# Patient Record
Sex: Male | Born: 1985 | Race: White | Hispanic: No | Marital: Married | Smoking: Never smoker
Health system: Southern US, Community
[De-identification: ages and names within clinical notes are randomized; demographics above are authoritative.]

## PROBLEM LIST (undated history)

## (undated) DIAGNOSIS — G4730 Sleep apnea, unspecified: Secondary | ICD-10-CM

## (undated) DIAGNOSIS — G5603 Carpal tunnel syndrome, bilateral upper limbs: Secondary | ICD-10-CM

## (undated) DIAGNOSIS — E669 Obesity, unspecified: Secondary | ICD-10-CM

## (undated) HISTORY — DX: Sleep apnea, unspecified: G47.30

## (undated) HISTORY — PX: CARPAL TUNNEL RELEASE: SHX101

## (undated) HISTORY — DX: Carpal tunnel syndrome, bilateral upper limbs: G56.03

## (undated) HISTORY — PX: KNEE SURGERY: SHX244

---

## 2012-02-07 ENCOUNTER — Emergency Department: Payer: Self-pay | Admitting: Emergency Medicine

## 2016-07-16 DIAGNOSIS — M25562 Pain in left knee: Secondary | ICD-10-CM | POA: Diagnosis not present

## 2016-07-16 DIAGNOSIS — Z131 Encounter for screening for diabetes mellitus: Secondary | ICD-10-CM | POA: Diagnosis not present

## 2016-07-16 DIAGNOSIS — Z0389 Encounter for observation for other suspected diseases and conditions ruled out: Secondary | ICD-10-CM | POA: Diagnosis not present

## 2016-07-16 DIAGNOSIS — R079 Chest pain, unspecified: Secondary | ICD-10-CM | POA: Diagnosis not present

## 2016-07-17 ENCOUNTER — Telehealth: Payer: Self-pay

## 2016-07-17 ENCOUNTER — Encounter: Payer: Self-pay | Admitting: Emergency Medicine

## 2016-07-17 ENCOUNTER — Emergency Department: Payer: BLUE CROSS/BLUE SHIELD

## 2016-07-17 ENCOUNTER — Emergency Department
Admission: EM | Admit: 2016-07-17 | Discharge: 2016-07-17 | Disposition: A | Discharge status: Home / Self Care | Payer: BLUE CROSS/BLUE SHIELD | Attending: Emergency Medicine | Admitting: Emergency Medicine

## 2016-07-17 DIAGNOSIS — R0602 Shortness of breath: Secondary | ICD-10-CM | POA: Insufficient documentation

## 2016-07-17 DIAGNOSIS — R0789 Other chest pain: Secondary | ICD-10-CM | POA: Diagnosis not present

## 2016-07-17 DIAGNOSIS — R079 Chest pain, unspecified: Secondary | ICD-10-CM | POA: Insufficient documentation

## 2016-07-17 LAB — TROPONIN I: Troponin I: <0.03 ng/mL (ref <0.03)

## 2016-07-17 LAB — CBC
HCT: 44.2 % (ref 40.0–52.0)
Hemoglobin: 15.6 g/dL (ref 13.0–18.0)
MCH: 29.2 pg (ref 26.0–34.0)
MCHC: 35.3 g/dL (ref 32.0–36.0)
MCV: 82.8 fL (ref 80.0–100.0)
PLATELETS: 358 10*3/uL (ref 150–440)
RBC: 5.35 MIL/uL (ref 4.40–5.90)
RDW: 13 % (ref 11.5–14.5)
WBC: 9.6 10*3/uL (ref 3.8–10.6)

## 2016-07-17 LAB — BASIC METABOLIC PANEL
Anion gap: 8 (ref 5–15)
BUN: 14 mg/dL (ref 6–20)
CO2: 29 mmol/L (ref 22–32)
CREATININE: 1.15 mg/dL (ref 0.61–1.24)
Calcium: 9.4 mg/dL (ref 8.9–10.3)
Chloride: 101 mmol/L (ref 101–111)
GFR calc Af Amer: >60 mL/min (ref >60)
GFR calc non Af Amer: >60 mL/min (ref >60)
Glucose, Bld: 132 mg/dL — ABNORMAL HIGH (ref 65–99)
Potassium: 4 mmol/L (ref 3.5–5.1)
SODIUM: 138 mmol/L (ref 135–145)

## 2016-07-17 LAB — FIBRIN DERIVATIVES D-DIMER (ARMC ONLY): Fibrin derivatives D-dimer (ARMC): 162 (ref 0–499)

## 2016-07-17 MED ORDER — IBUPROFEN 600 MG PO TABS: 600.0000 mg | ORAL_TABLET | Freq: Three times a day (TID) | ORAL | 0 refills | Status: DC | PRN

## 2016-07-17 MED ORDER — DIAZEPAM 5 MG PO TABS: 5.0000 mg | ORAL_TABLET | Freq: Three times a day (TID) | ORAL | 0 refills | Status: DC | PRN

## 2016-07-17 NOTE — ED Provider Notes (Addendum)
[facility] Emergency Department Provider Note        Time seen: ----------------------------------------- 2:40 PM on 07/17/2016 -----------------------------------------    I have reviewed the triage vital signs and the nursing notes.   HISTORY  Chief Complaint Chest Pain    HPI Lauretta ChesterKevin C Formica is a 31 y.o. male who presents to the ER for ongoing chest pain for years but with shortness of breath recently. [facility] was told he needed to see a cardiologist. Patient states EKG they did yesterday showed some abnormalities. [facility] so he came here. His chest pain was 7 out of 10, nothing makes it better or worse. He does admit to being under stress. He denies recent illness. He has no specific risk factors that he is aware of.   History reviewed. No pertinent past medical history.  There are no active problems to display for this patient.   History reviewed. No pertinent surgical history.  Allergies Patient has no known allergies.  Social History Social History  Substance Use Topics  . Smoking status: Never Smoker  . Smokeless tobacco: Never Used  . Alcohol use No    Review of Systems Constitutional: Negative for fever. Cardiovascular:Positive for chest pain Respiratory:  Positive shortness of breath Gastrointestinal: Negative for abdominal pain, vomiting and diarrhea. Genitourinary: Negative for dysuria. Musculoskeletal: Negative for back pain. Skin: Negative for rash. Neurological: Negative for headaches, focal weakness or numbness.  10-point ROS otherwise negative.  ____________________________________________   PHYSICAL EXAM:  VITAL SIGNS: ED Triage Vitals  Enc Vitals Group     BP 07/17/16 1300 (!) 153/92     Pulse Rate 07/17/16 1300 (!) 115     Resp 07/17/16 1300 18     Temp 07/17/16 1300 98.9 F (37.2 C)     Temp Source 07/17/16 1300 Oral   SpO2 07/17/16 1300 96 %     Weight 07/17/16 1301 285 lb (129.3 kg)     Height 07/17/16 1301 5\' 9"  (1.753 m)     Head Circumference --      Peak Flow --      Pain Score 07/17/16 1301 8     Pain Loc --      Pain Edu? --      Excl. in GC? --     Constitutional: Alert and oriented. Well appearing and in no distress. Eyes: Conjunctivae are normal. PERRL. Normal extraocular movements. ENT   Head: Normocephalic and atraumatic.   Nose: No congestion/rhinnorhea.   Mouth/Throat: Mucous membranes are moist.   Neck: No stridor. Cardiovascular: Normal rate, regular rhythm. No murmurs, rubs, or gallops. Respiratory: Normal respiratory effort without tachypnea nor retractions. Breath sounds are clear and equal bilaterally. No wheezes/rales/rhonchi. Gastrointestinal: Soft and nontender. Normal bowel sounds Musculoskeletal: Nontender with normal range of motion in all extremities. No lower extremity tenderness nor edema. Neurologic:  Normal speech and language. No gross focal neurologic deficits are appreciated.  Skin:  Skin is warm, dry and intact. No rash noted. Psychiatric: Mood and affect are normal. Speech and behavior are normal.  ____________________________________________  EKG: Interpreted by me. sinus tachycardia with rate of 109 bpm, normal PR interval, normal QRS, normal QT, normal axis.  ____________________________________________  ED COURSE:  Pertinent labs & imaging results that were available during my care of the patient were reviewed by me and considered in my medical decision making (see chart for details).  Patient  presents to ER with chest pain of uncertain etiology. We will assess with labs and imaging.   Procedures ____________________________________________   LABS (pertinent positives/negatives)  Labs Reviewed  BASIC METABOLIC PANEL - Abnormal; Notable for the following:       Result Value   Glucose, Bld 132 (*)    All other components within normal  limits  CBC  TROPONIN I  FIBRIN DERIVATIVES D-DIMER ([facility] ONLY)  TROPONIN I    RADIOLOGY  Chest x-ray is normal  ____________________________________________  FINAL ASSESSMENT AND PLAN   Chest pain  Plan: Patient with labs and imaging as dictated above.  No specific etiology for his symptoms. He'll be discharged with Motrin and Valium and encouraged to have close follow-up with outpatient cardiology. Heart score is low risk for ACS.   Emily Filbert, MD   Note: This note was generated in part or whole with voice recognition software. Voice recognition is usually quite accurate but there are transcription errors that can and very often do occur. I apologize for any typographical errors that were not detected and corrected.     Emily Filbert, MD 07/17/16 1442    Emily Filbert, MD 07/17/16 [phone]

## 2016-07-17 NOTE — ED Triage Notes (Signed)
Pt with ongoing chest pain for years but with shortness of breath recently. [facility] and was told he needed to see a cardiologist. Pt states the ecg they did yesterday showed that he had some abnormalities.  [facility] so he came here.

## 2016-07-17 NOTE — Telephone Encounter (Signed)
Lmov for patient for patient was seen in ED on 07/17/16  °Seen for CP  °Will try again at a later time °

## 2016-07-20 NOTE — Telephone Encounter (Signed)
Lmov for patient for patient was seen in ED on 07/17/16  Seen for CP  Will try again at a later time

## 2016-07-21 NOTE — Telephone Encounter (Signed)
Sent letter

## 2016-07-29 ENCOUNTER — Other Ambulatory Visit: Payer: Self-pay | Admitting: Unknown Physician Specialty

## 2016-07-29 ENCOUNTER — Encounter: Payer: Self-pay | Admitting: Unknown Physician Specialty

## 2016-07-29 ENCOUNTER — Encounter: Payer: Self-pay | Admitting: Family Medicine

## 2016-07-29 ENCOUNTER — Telehealth: Payer: Self-pay | Admitting: Unknown Physician Specialty

## 2016-07-29 ENCOUNTER — Ambulatory Visit (INDEPENDENT_AMBULATORY_CARE_PROVIDER_SITE_OTHER): Payer: BLUE CROSS/BLUE SHIELD | Admitting: Unknown Physician Specialty

## 2016-07-29 ENCOUNTER — Ambulatory Visit
Admission: RE | Admit: 2016-07-29 | Discharge: 2016-07-29 | Disposition: A | Discharge status: Home / Self Care | Payer: BLUE CROSS/BLUE SHIELD | Source: Ambulatory Visit | Attending: Unknown Physician Specialty | Admitting: Unknown Physician Specialty

## 2016-07-29 VITALS — BP 131/87 | HR 88 | Temp 98.3°F | Wt 289.2 lb

## 2016-07-29 DIAGNOSIS — J029 Acute pharyngitis, unspecified: Secondary | ICD-10-CM | POA: Diagnosis not present

## 2016-07-29 DIAGNOSIS — R05 Cough: Secondary | ICD-10-CM

## 2016-07-29 DIAGNOSIS — J018 Other acute sinusitis: Secondary | ICD-10-CM | POA: Diagnosis not present

## 2016-07-29 DIAGNOSIS — R059 Cough, unspecified: Secondary | ICD-10-CM

## 2016-07-29 DIAGNOSIS — J9811 Atelectasis: Secondary | ICD-10-CM | POA: Insufficient documentation

## 2016-07-29 MED ORDER — ALBUTEROL SULFATE HFA 108 (90 BASE) MCG/ACT IN AERS: 2.0000 | INHALATION_SPRAY | Freq: Four times a day (QID) | RESPIRATORY_TRACT | 2 refills | Status: DC | PRN

## 2016-07-29 MED ORDER — HYDROCOD POLST-CPM POLST ER 10-8 MG/5ML PO SUER: 5.0000 mL | Freq: Two times a day (BID) | ORAL | 0 refills | Status: DC | PRN

## 2016-07-29 MED ORDER — AZITHROMYCIN 250 MG PO TABS: ORAL_TABLET | ORAL | 0 refills | Status: DC

## 2016-07-29 NOTE — Progress Notes (Signed)
BP 131/87 (BP Location: Left Arm, Patient Position: Sitting, Cuff Size: Large)   Pulse 88   Temp 98.3 F (36.8 C)   Wt 289 lb 3.2 oz (131.2 kg) Comment: pt had boots on  SpO2 97%   BMI 42.71 kg/m    Subjective:    Patient ID: Craig Guerra, male    DOB: 1985-08-31, 31 y.o.   MRN: [id number]  HPI: Craig Guerra is a 31 y.o. male  Chief Complaint  Patient presents with  . URI    pt states he has had congestion, headache, and sore throat for about 2 weeks now    URI   This is a new problem. Episode onset: 10 days. The problem has been gradually worsening. Associated symptoms include congestion, coughing, headaches and rhinorrhea. He has tried decongestant for the symptoms.     Relevant past medical, surgical, family and social history reviewed and updated as indicated. Interim medical history since our last visit reviewed. Allergies and medications reviewed and updated.  Review of Systems  HENT: Positive for congestion and rhinorrhea.   Respiratory: Positive for cough.   Neurological: Positive for headaches.    Per HPI unless specifically indicated above     Objective:    BP 131/87 (BP Location: Left Arm, Patient Position: Sitting, Cuff Size: Large)   Pulse 88   Temp 98.3 F (36.8 C)   Wt 289 lb 3.2 oz (131.2 kg) Comment: pt had boots on  SpO2 97%   BMI 42.71 kg/m   Wt Readings from Last 3 Encounters:  07/29/16 289 lb 3.2 oz (131.2 kg)  07/17/16 285 lb (129.3 kg)    Physical Exam  Constitutional: He is oriented to person, place, and time. He appears well-developed and well-nourished. No distress.  HENT:  Head: Normocephalic and atraumatic.  Right Ear: Tympanic membrane and ear canal normal.  Left Ear: Tympanic membrane and ear canal normal.  Nose: Rhinorrhea present. Right sinus exhibits no maxillary sinus tenderness and no frontal sinus tenderness. Left sinus exhibits no maxillary sinus tenderness and no frontal sinus tenderness.  Mouth/Throat: Uvula  is midline. Posterior oropharyngeal edema present.  Eyes: Conjunctivae and lids are normal. Right eye exhibits no discharge. Left eye exhibits no discharge. No scleral icterus.  Neck: Neck supple.  Cardiovascular: Normal rate, regular rhythm and normal heart sounds.   Pulmonary/Chest: Effort normal and breath sounds normal. No respiratory distress.  Abdominal: Normal appearance. There is no splenomegaly or hepatomegaly.  Musculoskeletal: Normal range of motion.  Neurological: He is alert and oriented to person, place, and time.  Skin: Skin is warm, dry and intact. No rash noted. No pallor.  Psychiatric: He has a normal mood and affect. His behavior is normal. Judgment and thought content normal.  Nursing note and vitals reviewed.   Results for orders placed or performed during the hospital encounter of 07/17/16  Basic metabolic panel  Result Value Ref Range   Sodium 138 135 - 145 mmol/L   Potassium 4.0 3.5 - 5.1 mmol/L   Chloride 101 101 - 111 mmol/L   CO2 29 22 - 32 mmol/L   Glucose, Bld 132 (H) 65 - 99 mg/dL   BUN 14 6 - 20 mg/dL   Creatinine, Ser 4.09 0.61 - 1.24 mg/dL   Calcium 9.4 8.9 - 81.1 mg/dL   GFR calc non Af Amer >60 >60 mL/min   GFR calc Af Amer >60 >60 mL/min   Anion gap 8 5 - 15  CBC  Result Value  Ref Range   WBC 9.6 3.8 - 10.6 K/uL   RBC 5.35 4.40 - 5.90 MIL/uL   Hemoglobin 15.6 13.0 - 18.0 g/dL   HCT 54.044.2 98.140.0 - 19.152.0 %   MCV 82.8 80.0 - 100.0 fL   MCH 29.2 26.0 - 34.0 pg   MCHC 35.3 32.0 - 36.0 g/dL   RDW 47.813.0 29.511.5 - 62.114.5 %   Platelets 358 150 - 440 K/uL  Troponin I  Result Value Ref Range   Troponin I <0.03 <0.03 ng/mL  Fibrin derivatives D-Dimer ([facility] only)  Result Value Ref Range   Fibrin derivatives D-dimer ([facility]) 162 0 - 499  Troponin I  Result Value Ref Range   Troponin I <0.03 <0.03 ng/mL      Assessment & Plan:   Problem List Items Addressed This Visit    None    Visit Diagnoses    Sore throat    -  Primary   Relevant Orders   Rapid  strep screen (not at [facility])   Acute non-recurrent sinusitis of other sinus       Relevant Medications   azithromycin (ZITHROMAX Z-PAK) 250 MG tablet   chlorpheniramine-HYDROcodone (TUSSIONEX PENNKINETIC ER) 10-8 MG/5ML SUER   Cough       Relevant Orders   DG Chest 2 View      [facility]  Follow up plan: Return if symptoms worsen or fail to improve.

## 2016-07-31 ENCOUNTER — Telehealth: Payer: Self-pay | Admitting: Family Medicine

## 2016-07-31 LAB — CULTURE, GROUP A STREP

## 2016-07-31 LAB — RAPID STREP SCREEN (MED CTR MEBANE ONLY): STREP GP A AG, IA W/REFLEX: NEGATIVE

## 2016-07-31 MED ORDER — AMOXICILLIN 500 MG PO CAPS: 500.0000 mg | ORAL_CAPSULE | Freq: Two times a day (BID) | ORAL | 0 refills | Status: DC

## 2016-07-31 NOTE — Telephone Encounter (Signed)
Please let him know that his strep culture came back positive, so I sent a different antibiotic to his pharmacy.

## 2016-07-31 NOTE — Telephone Encounter (Signed)
Patient notified of results.

## 2016-08-18 DIAGNOSIS — G8929 Other chronic pain: Secondary | ICD-10-CM | POA: Diagnosis not present

## 2016-08-18 DIAGNOSIS — M25562 Pain in left knee: Secondary | ICD-10-CM | POA: Diagnosis not present

## 2016-08-18 NOTE — Telephone Encounter (Signed)
Encounter entered in error.

## 2016-09-28 ENCOUNTER — Ambulatory Visit: Payer: BLUE CROSS/BLUE SHIELD | Admitting: Unknown Physician Specialty

## 2016-09-30 ENCOUNTER — Telehealth: Payer: Self-pay | Admitting: Unknown Physician Specialty

## 2016-09-30 DIAGNOSIS — J209 Acute bronchitis, unspecified: Secondary | ICD-10-CM | POA: Diagnosis not present

## 2016-09-30 DIAGNOSIS — R0602 Shortness of breath: Secondary | ICD-10-CM | POA: Diagnosis not present

## 2016-09-30 NOTE — Telephone Encounter (Signed)
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

## 2016-09-30 NOTE — Telephone Encounter (Signed)
Patients wife wanted to let Craig Guerra know patient was seen today at a walk-in and was diagnosed with bronchitis   Just FYI for patients records

## 2016-09-30 NOTE — Telephone Encounter (Signed)
Routing to provider, FYI.  

## 2017-05-07 DIAGNOSIS — R509 Fever, unspecified: Secondary | ICD-10-CM | POA: Diagnosis not present

## 2017-05-07 DIAGNOSIS — J029 Acute pharyngitis, unspecified: Secondary | ICD-10-CM | POA: Diagnosis not present

## 2017-05-07 DIAGNOSIS — J020 Streptococcal pharyngitis: Secondary | ICD-10-CM | POA: Diagnosis not present

## 2017-05-17 ENCOUNTER — Inpatient Hospital Stay
Admit: 2017-05-17 | Discharge: 2017-05-17 | Disposition: A | Discharge status: Home / Self Care | Payer: BLUE CROSS/BLUE SHIELD | Attending: Registered Nurse | Admitting: Registered Nurse

## 2017-05-17 ENCOUNTER — Emergency Department: Payer: BLUE CROSS/BLUE SHIELD

## 2017-05-17 ENCOUNTER — Other Ambulatory Visit: Payer: Self-pay

## 2017-05-17 ENCOUNTER — Inpatient Hospital Stay
Admission: EM | Admit: 2017-05-17 | Discharge: 2017-05-18 | DRG: 280 | Disposition: A | Discharge status: Home / Self Care | Payer: BLUE CROSS/BLUE SHIELD | Attending: Specialist | Admitting: Specialist

## 2017-05-17 ENCOUNTER — Encounter: Payer: Self-pay | Admitting: Emergency Medicine

## 2017-05-17 DIAGNOSIS — G4733 Obstructive sleep apnea (adult) (pediatric): Secondary | ICD-10-CM | POA: Diagnosis present

## 2017-05-17 DIAGNOSIS — R079 Chest pain, unspecified: Secondary | ICD-10-CM

## 2017-05-17 DIAGNOSIS — R778 Other specified abnormalities of plasma proteins: Secondary | ICD-10-CM

## 2017-05-17 DIAGNOSIS — R7989 Other specified abnormal findings of blood chemistry: Secondary | ICD-10-CM

## 2017-05-17 DIAGNOSIS — E669 Obesity, unspecified: Secondary | ICD-10-CM | POA: Diagnosis present

## 2017-05-17 DIAGNOSIS — Z79899 Other long term (current) drug therapy: Secondary | ICD-10-CM | POA: Diagnosis not present

## 2017-05-17 DIAGNOSIS — I10 Essential (primary) hypertension: Secondary | ICD-10-CM | POA: Diagnosis present

## 2017-05-17 DIAGNOSIS — E785 Hyperlipidemia, unspecified: Secondary | ICD-10-CM | POA: Diagnosis not present

## 2017-05-17 DIAGNOSIS — Z6841 Body Mass Index (BMI) 40.0 and over, adult: Secondary | ICD-10-CM | POA: Diagnosis not present

## 2017-05-17 DIAGNOSIS — I214 Non-ST elevation (NSTEMI) myocardial infarction: Secondary | ICD-10-CM | POA: Diagnosis not present

## 2017-05-17 DIAGNOSIS — R0789 Other chest pain: Secondary | ICD-10-CM | POA: Diagnosis not present

## 2017-05-17 DIAGNOSIS — I409 Acute myocarditis, unspecified: Secondary | ICD-10-CM | POA: Diagnosis present

## 2017-05-17 DIAGNOSIS — Z8679 Personal history of other diseases of the circulatory system: Secondary | ICD-10-CM | POA: Diagnosis not present

## 2017-05-17 DIAGNOSIS — R748 Abnormal levels of other serum enzymes: Secondary | ICD-10-CM | POA: Diagnosis not present

## 2017-05-17 DIAGNOSIS — R0602 Shortness of breath: Secondary | ICD-10-CM | POA: Diagnosis not present

## 2017-05-17 HISTORY — DX: Obesity, unspecified: E66.9

## 2017-05-17 LAB — BASIC METABOLIC PANEL
ANION GAP: 8 (ref 5–15)
BUN: 13 mg/dL (ref 6–20)
CALCIUM: 9.3 mg/dL (ref 8.9–10.3)
CO2: 26 mmol/L (ref 22–32)
Chloride: 103 mmol/L (ref 101–111)
Creatinine, Ser: 0.84 mg/dL (ref 0.61–1.24)
GFR calc Af Amer: >60 mL/min (ref >60)
GFR calc non Af Amer: >60 mL/min (ref >60)
GLUCOSE: 112 mg/dL — AB (ref 65–99)
POTASSIUM: 4.1 mmol/L (ref 3.5–5.1)
Sodium: 137 mmol/L (ref 135–145)

## 2017-05-17 LAB — TSH: TSH: 1.247 u[IU]/mL (ref 0.350–4.500)

## 2017-05-17 LAB — CBC
HEMATOCRIT: 46.2 % (ref 40.0–52.0)
HEMOGLOBIN: 15.7 g/dL (ref 13.0–18.0)
MCH: 28.1 pg (ref 26.0–34.0)
MCHC: 33.9 g/dL (ref 32.0–36.0)
MCV: 83 fL (ref 80.0–100.0)
Platelets: 375 10*3/uL (ref 150–440)
RBC: 5.57 MIL/uL (ref 4.40–5.90)
RDW: 13.3 % (ref 11.5–14.5)
WBC: 17 10*3/uL — ABNORMAL HIGH (ref 3.8–10.6)

## 2017-05-17 LAB — ECHOCARDIOGRAM COMPLETE
Height: 69 in
Weight: 4398.4 oz

## 2017-05-17 LAB — TROPONIN I
Troponin I: 2.54 ng/mL (ref <0.03)
Troponin I: 2.74 ng/mL (ref <0.03)
Troponin I: 2.73 ng/mL (ref <0.03)
Troponin I: 2.87 ng/mL (ref <0.03)
Troponin I: 4.66 ng/mL (ref <0.03)

## 2017-05-17 MED ORDER — ONDANSETRON HCL 4 MG PO TABS: 4.0000 mg | ORAL_TABLET | Freq: Four times a day (QID) | ORAL | Status: DC | PRN

## 2017-05-17 MED ORDER — MORPHINE SULFATE (PF) 2 MG/ML IV SOLN
2.0000 mg | INTRAVENOUS | Status: DC | PRN
  Administered 2017-05-18 (×2): 2 mg via INTRAVENOUS
  Filled 2017-05-17 (×2): qty 1

## 2017-05-17 MED ORDER — ENOXAPARIN SODIUM 150 MG/ML ~~LOC~~ SOLN
1.0000 mg/kg | Freq: Two times a day (BID) | SUBCUTANEOUS | Status: DC
  Administered 2017-05-17: 130 mg via SUBCUTANEOUS
  Filled 2017-05-17 (×2): qty 0.85

## 2017-05-17 MED ORDER — DOCUSATE SODIUM 100 MG PO CAPS
100.0000 mg | ORAL_CAPSULE | Freq: Two times a day (BID) | ORAL | Status: DC
  Administered 2017-05-17: 100 mg via ORAL
  Filled 2017-05-17: qty 1

## 2017-05-17 MED ORDER — INFLUENZA VAC SPLIT QUAD 0.5 ML IM SUSY: 0.5000 mL | PREFILLED_SYRINGE | INTRAMUSCULAR | Status: DC

## 2017-05-17 MED ORDER — MORPHINE SULFATE (PF) 2 MG/ML IV SOLN
2.0000 mg | INTRAVENOUS | Status: DC | PRN
  Administered 2017-05-17: 2 mg via INTRAVENOUS
  Filled 2017-05-17: qty 1

## 2017-05-17 MED ORDER — SODIUM CHLORIDE 0.9 % IV SOLN
INTRAVENOUS | Status: DC
  Administered 2017-05-17 (×2): via INTRAVENOUS

## 2017-05-17 MED ORDER — ENOXAPARIN SODIUM 150 MG/ML ~~LOC~~ SOLN
1.0000 mg/kg | Freq: Two times a day (BID) | SUBCUTANEOUS | Status: DC
  Administered 2017-05-17: 125 mg via SUBCUTANEOUS
  Filled 2017-05-17 (×3): qty 0.83

## 2017-05-17 MED ORDER — ASPIRIN EC 81 MG PO TBEC
81.0000 mg | DELAYED_RELEASE_TABLET | Freq: Every day | ORAL | Status: DC
  Administered 2017-05-17: 81 mg via ORAL
  Filled 2017-05-17 (×2): qty 1

## 2017-05-17 MED ORDER — KETOROLAC TROMETHAMINE 30 MG/ML IJ SOLN
30.0000 mg | Freq: Four times a day (QID) | INTRAMUSCULAR | Status: DC | PRN
  Administered 2017-05-17 (×2): 30 mg via INTRAVENOUS
  Filled 2017-05-17 (×2): qty 1

## 2017-05-17 MED ORDER — ATORVASTATIN CALCIUM 20 MG PO TABS
40.0000 mg | ORAL_TABLET | Freq: Every day | ORAL | Status: DC
  Administered 2017-05-17: 40 mg via ORAL
  Filled 2017-05-17: qty 4

## 2017-05-17 MED ORDER — NITROGLYCERIN 2 % TD OINT
0.5000 [in_us] | TOPICAL_OINTMENT | Freq: Four times a day (QID) | TRANSDERMAL | Status: DC | PRN
  Administered 2017-05-18: 0.5 [in_us] via TOPICAL
  Filled 2017-05-17: qty 1

## 2017-05-17 MED ORDER — METOPROLOL TARTRATE 25 MG PO TABS
25.0000 mg | ORAL_TABLET | Freq: Two times a day (BID) | ORAL | Status: DC
  Administered 2017-05-17 (×2): 25 mg via ORAL
  Filled 2017-05-17 (×2): qty 1

## 2017-05-17 MED ORDER — ONDANSETRON HCL 4 MG/2ML IJ SOLN
4.0000 mg | Freq: Four times a day (QID) | INTRAMUSCULAR | Status: DC | PRN
  Administered 2017-05-17 – 2017-05-18 (×2): 4 mg via INTRAVENOUS
  Filled 2017-05-17 (×2): qty 2

## 2017-05-17 MED ORDER — ONDANSETRON HCL 4 MG/2ML IJ SOLN
4.0000 mg | Freq: Once | INTRAMUSCULAR | Status: AC
  Administered 2017-05-17: 4 mg via INTRAVENOUS
  Filled 2017-05-17: qty 2

## 2017-05-17 MED ORDER — ACETAMINOPHEN 325 MG PO TABS
650.0000 mg | ORAL_TABLET | Freq: Four times a day (QID) | ORAL | Status: DC | PRN
  Filled 2017-05-17: qty 2

## 2017-05-17 MED ORDER — ACETAMINOPHEN 650 MG RE SUPP: 650.0000 mg | Freq: Four times a day (QID) | RECTAL | Status: DC | PRN

## 2017-05-17 MED ORDER — NITROGLYCERIN 0.4 MG SL SUBL
0.4000 mg | SUBLINGUAL_TABLET | SUBLINGUAL | Status: DC | PRN
  Administered 2017-05-17 – 2017-05-18 (×4): 0.4 mg via SUBLINGUAL
  Filled 2017-05-17 (×3): qty 1

## 2017-05-17 MED ORDER — IOPAMIDOL (ISOVUE-370) INJECTION 76%
100.0000 mL | Freq: Once | INTRAVENOUS | Status: AC | PRN
  Administered 2017-05-17: 100 mL via INTRAVENOUS

## 2017-05-17 MED ORDER — MORPHINE SULFATE (PF) 4 MG/ML IV SOLN
4.0000 mg | Freq: Once | INTRAVENOUS | Status: AC
  Administered 2017-05-17: 4 mg via INTRAVENOUS
  Filled 2017-05-17: qty 1

## 2017-05-17 NOTE — ED Notes (Signed)
ED Provider at bedside. 

## 2017-05-17 NOTE — ED Notes (Signed)
Pt will be transported to room 249 as soon as ER Tech available.

## 2017-05-17 NOTE — Progress Notes (Signed)
States chest pain 5/10, Nitro 0.4mg  SL given. Will monitor.

## 2017-05-17 NOTE — H&P (Signed)
Craig Guerra is an 31 y.o. male.   Chief Complaint: Chest pain HPI: The patient with past medical history of obesity presents to emergency department complaining of chest pain.  The patient admits that his central chest began to hurt yesterday afternoon but gradually increased overnight.  The pain was pressure like in quality and radiated into his shoulders.  He has had pain similar to this before but less severe.  Pain usually occurs when he is doing housework.  The [facility].  His EKG was abnormal but did not meet criteria for ACS.  He has been trying to get cardiology evaluation since that time but has been unsuccessful.  Troponin was found to be 2.5 in the emergency department.  Thus, the emergency department staff called the hospitalist service for further evaluation.  Past Medical History:  Diagnosis Date  . Obesity     Past Surgical History:  Procedure Laterality Date  . KNEE SURGERY Left     Family History  Problem Relation Age of Onset  . Stroke Mother    Social History:  reports that  has never smoked. he has never used smokeless tobacco. He reports that he drinks alcohol. He reports that he does not use drugs.  Allergies: No Known Allergies  Prior to Admission medications   Medication Sig Start Date End Date Taking? Authorizing Provider  albuterol (PROVENTIL HFA;VENTOLIN HFA) 108 (90 Base) MCG/ACT inhaler Inhale 2 puffs into the lungs every 6 (six) hours as needed for wheezing or shortness of breath. 07/29/16  Yes Kathrine Haddock, NP  ibuprofen (ADVIL,MOTRIN) 600 MG tablet Take 1 tablet (600 mg total) by mouth every 8 (eight) hours as needed. Patient taking differently: Take 600 mg by mouth every 8 (eight) hours as needed for mild pain.  07/17/16  Yes Earleen Newport, MD  magic mouthwash w/lidocaine SOLN Swish and spit 5 mLs every 6 (six) hours as needed for pain. 05/07/17  Yes [provider]      Results for orders placed or performed during the hospital encounter of 05/17/17 (from the past 48 hour(s))  Basic metabolic panel     Status: Abnormal   Collection Time: 05/17/17  4:56 AM  Result Value Ref Range   Sodium 137 135 - 145 mmol/L   Potassium 4.1 3.5 - 5.1 mmol/L   Chloride 103 101 - 111 mmol/L   CO2 26 22 - 32 mmol/L   Glucose, Bld 112 (H) 65 - 99 mg/dL   BUN 13 6 - 20 mg/dL   Creatinine, Ser 0.84 0.61 - 1.24 mg/dL   Calcium 9.3 8.9 - 10.3 mg/dL   GFR calc non Af Amer >60 >60 mL/min   GFR calc Af Amer >60 >60 mL/min    Comment: (NOTE) The eGFR has been calculated using the CKD EPI equation. This calculation has not been validated in all clinical situations. eGFR's persistently <60 mL/min signify possible Chronic Kidney Disease.    Anion gap 8 5 - 15  CBC     Status: Abnormal   Collection Time: 05/17/17  4:56 AM  Result Value Ref Range   WBC 17.0 (H) 3.8 - 10.6 K/uL   RBC 5.57 4.40 - 5.90 MIL/uL   Hemoglobin 15.7 13.0 - 18.0 g/dL   HCT 46.2 40.0 - 52.0 %   MCV 83.0 80.0 - 100.0 fL   MCH 28.1 26.0 - 34.0 pg   MCHC 33.9 32.0 - 36.0 g/dL  RDW 13.3 11.5 - 14.5 %   Platelets 375 150 - 440 K/uL  Troponin I     Status: Abnormal   Collection Time: 05/17/17  4:56 AM  Result Value Ref Range   Troponin I 2.54 (HH) <0.03 ng/mL    Comment: CRITICAL RESULT CALLED TO, READ BACK BY AND VERIFIED WITH LAURIE ALLEN AT 3016 05/17/17.PMH   Dg Chest 2 View  Result Date: 05/17/2017 CLINICAL DATA:  Left-sided chest pain. EXAM: CHEST  2 VIEW COMPARISON:  07/29/2016 FINDINGS: The cardiomediastinal contours are normal. The lungs are clear. Pulmonary vasculature is normal. No consolidation, pleural effusion, or pneumothorax. No acute osseous abnormalities are seen. IMPRESSION: No acute pulmonary process. Electronically Signed   By: Jeb Levering M.D.   On: 05/17/2017 05:31   Ct Angio Chest/abd/pel For Dissection W And/or Wo Contrast  Result Date: 05/17/2017 CLINICAL DATA:   Left chest pain starting yesterday afternoon come extending into the neck. Shortness of breath. Elevated troponin. EXAM: CT ANGIOGRAPHY CHEST, ABDOMEN AND PELVIS TECHNIQUE: Multidetector CT imaging through the chest, abdomen and pelvis was performed using the standard protocol during bolus administration of intravenous contrast. Multiplanar reconstructed images and MIPs were obtained and reviewed to evaluate the vascular anatomy. CONTRAST:  171m ISOVUE-370 IOPAMIDOL (ISOVUE-370) INJECTION 76% COMPARISON:  Chest radiograph 05/17/2017 FINDINGS: CTA CHEST FINDINGS Cardiovascular: No accentuated density along the aortic or branch vasculature suggest acute intramural hematoma on the noncontrast images. No thoracic aortic dissection is observed. Although today' s exam was protocol to assess the aorta and systemic arterial vasculature, there is enough pulmonary arterial contrast to indicate that there are no large or medium-sized pulmonary emboli. No significant atherosclerotic vascular calcification in the chest. No cardiomegaly or pericardial effusion. Mediastinum/Nodes: No adenopathy noted. Minimal thymic remnant. Esophagus unremarkable. Lungs/Pleura: 3 mm left upper lobe nodule on image 69/8, highly likely to be benign/ incidental in this age group. Suspected subsegmental atelectasis in the superior segment right lower lobe, dependent location. Musculoskeletal: Unremarkable Review of the MIP images confirms the above findings. CTA ABDOMEN AND PELVIS FINDINGS VASCULAR Aorta: Unremarkable Celiac: Unremarkable ; conventional hepatic arterial anatomy branching. SMA: Unremarkable Renals: Single bilateral renal arteries appear unremarkable. IMA: Unremarkable Inflow: Unremarkable Veins: Unremarkable Review of the MIP images confirms the above findings. NON-VASCULAR Hepatobiliary: Hepatic steatosis with some fatty sparing around the gallbladder fossa. Otherwise normal arterial phase appearance the liver. Pancreas:  Unremarkable Spleen: Unremarkable Adrenals/Urinary Tract: Unremarkable Stomach/Bowel: Unremarkable Lymphatic: Left external iliac node 0.9 cm in short axis, upper normal size, image 94/9. No overtly pathologic adenopathy. Reproductive: Unremarkable Other: No supplemental non-categorized findings. Musculoskeletal: Small direct left inguinal hernia contains adipose tissue. Review of the MIP images confirms the above findings. IMPRESSION: 1. No findings of dissection or acute vascular abnormality. A specific cause for the patient's left chest pain and shortness of breath is not identified. 2. Hepatic steatosis. 3. Small direct left inguinal hernia contains adipose tissue. Electronically Signed   By: WVan ClinesM.D.   On: 05/17/2017 07:41    Review of Systems  Constitutional: Negative for chills and fever.  HENT: Negative for sore throat and tinnitus.   Eyes: Negative for blurred vision and redness.  Respiratory: Positive for shortness of breath. Negative for cough.   Cardiovascular: Positive for chest pain. Negative for palpitations, orthopnea and PND.  Gastrointestinal: Positive for nausea. Negative for abdominal pain, diarrhea and vomiting.  Genitourinary: Negative for dysuria, frequency and urgency.  Musculoskeletal: Negative for joint pain and myalgias.  Skin: Negative for rash.  No lesions  Neurological: Negative for speech change, focal weakness and weakness.  Endo/Heme/Allergies: Does not bruise/bleed easily.       No temperature intolerance  Psychiatric/Behavioral: Negative for depression and suicidal ideas.    Blood pressure (!) 141/80, pulse 93, temperature 98.4 F (36.9 C), temperature source Oral, resp. rate 20, height '5\' 9"'$  (1.753 m), weight 127 kg (280 lb), SpO2 98 %. Physical Exam  Vitals reviewed. Constitutional: He is oriented to person, place, and time. He appears well-developed and well-nourished.  HENT:  Head: Normocephalic and atraumatic.  Mouth/Throat:  Oropharynx is clear and moist.  Eyes: Conjunctivae and EOM are normal. Pupils are equal, round, and reactive to light. No scleral icterus.  Neck: Normal range of motion. Neck supple. No JVD present. No tracheal deviation present. No thyromegaly present.  Cardiovascular: Normal rate, regular rhythm and normal heart sounds. Exam reveals no gallop and no friction rub.  No murmur heard. Respiratory: Effort normal and breath sounds normal. No respiratory distress.  GI: Soft. Bowel sounds are normal. He exhibits no distension. There is no tenderness.  Genitourinary:  Genitourinary Comments: Deferred  Musculoskeletal: Normal range of motion. He exhibits no edema.  Lymphadenopathy:    He has no cervical adenopathy.  Neurological: He is alert and oriented to person, place, and time. No cranial nerve deficit.  Skin: Skin is warm and dry. No rash noted. No erythema.  Psychiatric: He has a normal mood and affect. His behavior is normal. Judgment and thought content normal.     Assessment/Plan This is a 31 year old male admitted for NSTEMI.  1. NSTEMI: Despite elevated troponin greater than to be expected for demand ischemia.  Continue to monitor telemetry and follow cardiac biomarkers.  Cardiology consulted.  Echocardiogram ordered.  Nitroglycerin for chest pain. 2.  Obesity: MI 41.3; encouraged healthy diet and exercise. 3.  Elevated blood pressure: The patient does not carry a diagnosis of hypertension.  Continue to obtain blood pressure readings while hospitalized.  Will start beta-blocker to decrease myocardial oxygen demand. 4.  DVT prophylaxis: Therapeutic Lovenox 5.  GI prophylaxis: None The patient is a full code.  Time spent on admission orders and patient care approximately 45 minutes  Harrie Foreman, MD 05/17/2017, 7:58 AM

## 2017-05-17 NOTE — Progress Notes (Signed)
[employer] - [place] [facility]   PATIENT NAME: Craig FreezeKevin Guerra    MR#:  [id number]  DATE OF BIRTH:  April 16, 1986  SUBJECTIVE:   Patient here with chest pain and noted to have an elevated troponin consistent with a non-ST elevation MI. Patient continues to complain of some chest pain. Denies any shortness of breath, nausea vomiting palpitations or any other associated symptoms. Discussed with cardiology and plan for possible cardiac catheterization tomorrow.  REVIEW OF SYSTEMS:    Review of Systems  Constitutional: Negative for chills and fever.  HENT: Negative for congestion and tinnitus.   Eyes: Negative for blurred vision and double vision.  Respiratory: Negative for cough, shortness of breath and wheezing.   Cardiovascular: Positive for chest pain. Negative for orthopnea and PND.  Gastrointestinal: Negative for abdominal pain, diarrhea, nausea and vomiting.  Genitourinary: Negative for dysuria and hematuria.  Neurological: Negative for dizziness, sensory change and focal weakness.  All other systems reviewed and are negative.   Nutrition: Heart Healthy Tolerating Diet: Yes Tolerating PT: Ambulatory  DRUG ALLERGIES:  No Known Allergies  VITALS:  Blood pressure 107/72, pulse (!) 103, temperature 98.1 F (36.7 C), temperature source Oral, resp. rate 15, height 5\' 9"  (1.753 m), weight 124.7 kg (274 lb 14.4 oz), SpO2 100 %.  PHYSICAL EXAMINATION:   Physical Exam  GENERAL:  31 y.o.-year-old obes patient lying in bed in no acute distress.  EYES: Pupils equal, round, reactive to light and accommodation. No scleral icterus. Extraocular muscles intact.  HEENT: Head atraumatic, normocephalic. Oropharynx and nasopharynx clear.  NECK:  Supple, no jugular venous distention. No thyroid enlargement, no tenderness.  LUNGS: Normal breath sounds bilaterally, no wheezing, rales, rhonchi. No use of accessory muscles of respiration.  CARDIOVASCULAR: S1, S2 normal. No  murmurs, rubs, or gallops.  ABDOMEN: Soft, nontender, nondistended. Bowel sounds present. No organomegaly or mass.  EXTREMITIES: No cyanosis, clubbing or edema b/l.    NEUROLOGIC: Cranial nerves II through XII are intact. No focal Motor or sensory deficits b/l.   PSYCHIATRIC: The patient is alert and oriented x 3.  SKIN: No obvious rash, lesion, or ulcer.    LABORATORY PANEL:   CBC Recent Labs  Lab 05/17/17 0456  WBC 17.0*  HGB 15.7  HCT 46.2  PLT 375   ------------------------------------------------------------------------------------------------------------------  Chemistries  Recent Labs  Lab 05/17/17 0456  NA 137  K 4.1  CL 103  CO2 26  GLUCOSE 112*  BUN 13  CREATININE 0.84  CALCIUM 9.3   ------------------------------------------------------------------------------------------------------------------  Cardiac Enzymes Recent Labs  Lab 05/17/17 1005  TROPONINI 2.73*   ------------------------------------------------------------------------------------------------------------------  RADIOLOGY:  Dg Chest 2 View  Result Date: 05/17/2017 CLINICAL DATA:  Left-sided chest pain. EXAM: CHEST  2 VIEW COMPARISON:  07/29/2016 FINDINGS: The cardiomediastinal contours are normal. The lungs are clear. Pulmonary vasculature is normal. No consolidation, pleural effusion, or pneumothorax. No acute osseous abnormalities are seen. IMPRESSION: No acute pulmonary process. Electronically Signed   By: Rubye OaksMelanie  Ehinger M.D.   On: 05/17/2017 05:31   Ct Angio Chest/abd/pel For Dissection W And/or Wo Contrast  Result Date: 05/17/2017 CLINICAL DATA:  Left chest pain starting yesterday afternoon come extending into the neck. Shortness of breath. Elevated troponin. EXAM: CT ANGIOGRAPHY CHEST, ABDOMEN AND PELVIS TECHNIQUE: Multidetector CT imaging through the chest, abdomen and pelvis was performed using the standard protocol during bolus administration of intravenous contrast. Multiplanar  reconstructed images and MIPs were obtained and reviewed to evaluate the vascular anatomy. CONTRAST:  100mL ISOVUE-370 IOPAMIDOL (  ISOVUE-370) INJECTION 76% COMPARISON:  Chest radiograph 05/17/2017 FINDINGS: CTA CHEST FINDINGS Cardiovascular: No accentuated density along the aortic or branch vasculature suggest acute intramural hematoma on the noncontrast images. No thoracic aortic dissection is observed. Although today' s exam was protocol to assess the aorta and systemic arterial vasculature, there is enough pulmonary arterial contrast to indicate that there are no large or medium-sized pulmonary emboli. No significant atherosclerotic vascular calcification in the chest. No cardiomegaly or pericardial effusion. Mediastinum/Nodes: No adenopathy noted. Minimal thymic remnant. Esophagus unremarkable. Lungs/Pleura: 3 mm left upper lobe nodule on image 69/8, highly likely to be benign/ incidental in this age group. Suspected subsegmental atelectasis in the superior segment right lower lobe, dependent location. Musculoskeletal: Unremarkable Review of the MIP images confirms the above findings. CTA ABDOMEN AND PELVIS FINDINGS VASCULAR Aorta: Unremarkable Celiac: Unremarkable ; conventional hepatic arterial anatomy branching. SMA: Unremarkable Renals: Single bilateral renal arteries appear unremarkable. IMA: Unremarkable Inflow: Unremarkable Veins: Unremarkable Review of the MIP images confirms the above findings. NON-VASCULAR Hepatobiliary: Hepatic steatosis with some fatty sparing around the gallbladder fossa. Otherwise normal arterial phase appearance the liver. Pancreas: Unremarkable Spleen: Unremarkable Adrenals/Urinary Tract: Unremarkable Stomach/Bowel: Unremarkable Lymphatic: Left external iliac node 0.9 cm in short axis, upper normal size, image 94/9. No overtly pathologic adenopathy. Reproductive: Unremarkable Other: No supplemental non-categorized findings. Musculoskeletal: Small direct left inguinal hernia  contains adipose tissue. Review of the MIP images confirms the above findings. IMPRESSION: 1. No findings of dissection or acute vascular abnormality. A specific cause for the patient's left chest pain and shortness of breath is not identified. 2. Hepatic steatosis. 3. Small direct left inguinal hernia contains adipose tissue. Electronically Signed   By: Gaylyn RongWalter  Liebkemann M.D.   On: 05/17/2017 07:41     ASSESSMENT AND PLAN:   31 year old male with past medical history of obesity, obstructive sleep apnea, resistance to the hospital due to chest pain and noted to have an elevated troponin.  1. Non-ST elevation MI-patient has ruled in by his cardiac markers. His EKG although she was no acute ST or T-wave changes. He has a strong family history of heart disease but has no other risk factors presently. -He continues to complain of some mild chest pain. Echocardiogram showing normal ejection fraction with no wall motion on allergies, CT angiogram of the chest abdomen pelvis also showing no acute pathology. -Discussed with cardiology and plan for cardiac catheterization tomorrow morning. - cont. ASA, Lovenox, Metoprolol, Atorvastatin.   2. Essential HTN - cont. Metoprolol  3. Hyperlipidemia - check Lipid profile.started on Statin due to # 1.     All the records are reviewed and case discussed with Care Management/Social Worker. Management plans discussed with the patient, family and they are in agreement.  CODE STATUS: Full code  DVT Prophylaxis: Lovenox  TOTAL TIME TAKING CARE OF THIS PATIENT: 30 minutes.   POSSIBLE D/C IN 1-2 DAYS, DEPENDING ON CLINICAL CONDITION.   Houston SirenSAINANI,VIVEK J M.D on 05/17/2017 at 3:08 PM  Between 7am to 6pm - Pager - [phone]  After 6pm go to [email or web address] - Scientist, research (life sciences)password EPAS [facility]  [facility] [place] [facility]  Office  [phone]  CC: Primary care physician; Gabriel CirriWicker, Cheryl, NP

## 2017-05-17 NOTE — Progress Notes (Signed)
Pt states pain down to 3/10, will continue to monitor

## 2017-05-17 NOTE — ED Triage Notes (Signed)
Pt reports left sided chest pain since yesterday afternoon; started near center of his chest and has moved to upper left chest; radiates up into left side of his neck, down left arm to bicep and through to back; constant but varies in severity; has slept very little; burning squeezing pain; mild nausea; some shortness of breath; dry cough; pt alert and oriented x 3; skin warm and dry; talking in complete coherent sentences

## 2017-05-17 NOTE — ED Notes (Signed)
Pt taken to CT. Admitting in to see pt.

## 2017-05-17 NOTE — Progress Notes (Signed)
Troponin level 4.66 reported. Pt c/o 5/10 chest pain at 2200. Pain was relieved with one SL nitro. Pt is asymptomatic. MD Anne HahnWillis made aware. PRN nitropaste ordered. Will continue to monitor.

## 2017-05-17 NOTE — Plan of Care (Signed)
  Not Progressing Clinical Measurements: Diagnostic test results will improve 05/17/2017 1110 - Not Progressing by Donald ProseBerry, Hansika Leaming L, RN Note First 2 troponins positive Pain Managment: General experience of comfort will improve 05/17/2017 1110 - Not Progressing by Donald ProseBerry, Braxston Quinter L, RN Note Prn medications for pain Activity: Ability to tolerate increased activity will improve 05/17/2017 1110 - Not Progressing by Donald ProseBerry, Clenton Esper L, RN Note Pt states pain increases with movement

## 2017-05-17 NOTE — ED Provider Notes (Signed)
[facility] Emergency Department Provider Note   ____________________________________________   First MD Initiated Contact with Patient 05/17/17 [phone]     (approximate)  I have reviewed the triage vital signs and the nursing notes.   HISTORY  Chief Complaint Chest Pain    HPI Craig Guerra is a 31 y.o. male who comes into the hospital today with chest pain that started yesterday.  The patient states that he was recently diagnosed with strep throat.  He states that the chest pain has become more intense and is been spreading across his chest.  He woke up sweating today according to his significant other.  The patient's chest pain is in his mid chest to his left arm and then goes into his neck and his back.  The patient has had some shortness of breath.  He states that it hurts to breathe and his throat also hurts.  The patient has been coughing as well.  He rates pain at 3-4 out of 10 in intensity.  He has been taking Tylenol and Motrin but he reports that he still having some pain.  He had some nausea this morning but reports that the Augmentin he was taking for his strep throat was making him vomit.  The patient is here today for evaluation.  Past Medical History:  Diagnosis Date  . Obesity     Patient Active Problem List   Diagnosis Date Noted  . NSTEMI (non-ST elevated myocardial infarction) (HCC) 05/17/2017    Past Surgical History:  Procedure Laterality Date  . KNEE SURGERY Left     Prior to Admission medications   Medication Sig Start Date End Date Taking? Authorizing Provider  albuterol (PROVENTIL HFA;VENTOLIN HFA) 108 (90 Base) MCG/ACT inhaler Inhale 2 puffs into the lungs every 6 (six) hours as needed for wheezing or shortness of breath. 07/29/16  Yes Gabriel CirriWicker, Cheryl, NP  ibuprofen (ADVIL,MOTRIN) 600 MG tablet Take 1 tablet (600 mg total) by mouth every 8 (eight) hours as needed. Patient taking differently: Take 600 mg by mouth every 8  (eight) hours as needed for mild pain.  07/17/16  Yes Emily FilbertWilliams, Jonathan E, MD  magic mouthwash w/lidocaine SOLN Swish and spit 5 mLs every 6 (six) hours as needed for pain. 05/07/17  Yes [provider]    Allergies Patient has no known allergies.  Family History  Problem Relation Age of Onset  . Stroke Mother     Social History Social History   Tobacco Use  . Smoking status: Never Smoker  . Smokeless tobacco: Never Used  Substance Use Topics  . Alcohol use: Yes    Comment: on very rare occasion   . Drug use: No    Review of Systems  Constitutional: sweats Eyes: No visual changes. ENT: No sore throat. Cardiovascular:  chest pain. Respiratory:  shortness of breath. Gastrointestinal: Nausea with No abdominal pain, no vomiting.  No diarrhea.  No constipation. Genitourinary: Negative for dysuria. Musculoskeletal: Negative for back pain. Skin: Negative for rash. Neurological: Negative for headaches, focal weakness or numbness.   ____________________________________________   PHYSICAL EXAM:  VITAL SIGNS: ED Triage Vitals  Enc Vitals Group     BP 05/17/17 0501 (!) 148/100     Pulse Rate 05/17/17 0501 (!) 102     Resp 05/17/17 0501 18     Temp 05/17/17 0501 98.4 F (36.9 C)     Temp Source 05/17/17 0501 Oral     SpO2 05/17/17 0501 95 %  Weight 05/17/17 0457 280 lb (127 kg)     Height 05/17/17 0457 5\' 9"  (1.753 m)     Head Circumference --      Peak Flow --      Pain Score 05/17/17 0457 5     Pain Loc --      Pain Edu? --      Excl. in GC? --     Constitutional: Alert and oriented. Well appearing and in mild distress. Eyes: Conjunctivae are normal. PERRL. EOMI. Head: Atraumatic. Nose: No congestion/rhinnorhea. Mouth/Throat: Mucous membranes are moist.  Oropharynx non-erythematous. Cardiovascular: Normal rate, regular rhythm. Grossly normal heart sounds.  Good peripheral circulation. Respiratory: Normal respiratory effort.  No retractions. Lungs  CTAB. Gastrointestinal: Soft and nontender. No distention.  Musculoskeletal: No lower extremity tenderness nor edema.  Neurologic:  Normal speech and language.  Skin:  Skin is warm, dry and intact.  Psychiatric: Mood and affect are normal.  ____________________________________________   LABS (all labs ordered are listed, but only abnormal results are displayed)  Labs Reviewed  BASIC METABOLIC PANEL - Abnormal; Notable for the following components:      Result Value   Glucose, Bld 112 (*)    All other components within normal limits  CBC - Abnormal; Notable for the following components:   WBC 17.0 (*)    All other components within normal limits  TROPONIN I - Abnormal; Notable for the following components:   Troponin I 2.54 (*)    All other components within normal limits  TROPONIN I   ____________________________________________  EKG  ED ECG REPORT I, Rebecka ApleyWebster,  Alexandros Ewan P, the attending physician, personally viewed and interpreted this ECG.   Date: 05/17/2017  EKG Time: 456  Rate: 103  Rhythm: sinus tachycardia  Axis: normal  Intervals:none  ST&T Change: none  ____________________________________________  RADIOLOGY  Dg Chest 2 View  Result Date: 05/17/2017 CLINICAL DATA:  Left-sided chest pain. EXAM: CHEST  2 VIEW COMPARISON:  07/29/2016 FINDINGS: The cardiomediastinal contours are normal. The lungs are clear. Pulmonary vasculature is normal. No consolidation, pleural effusion, or pneumothorax. No acute osseous abnormalities are seen. IMPRESSION: No acute pulmonary process. Electronically Signed   By: Rubye OaksMelanie  Ehinger M.D.   On: 05/17/2017 05:31   Ct Angio Chest/abd/pel For Dissection W And/or Wo Contrast  Result Date: 05/17/2017 CLINICAL DATA:  Left chest pain starting yesterday afternoon come extending into the neck. Shortness of breath. Elevated troponin. EXAM: CT ANGIOGRAPHY CHEST, ABDOMEN AND PELVIS TECHNIQUE: Multidetector CT imaging through the chest, abdomen  and pelvis was performed using the standard protocol during bolus administration of intravenous contrast. Multiplanar reconstructed images and MIPs were obtained and reviewed to evaluate the vascular anatomy. CONTRAST:  100mL ISOVUE-370 IOPAMIDOL (ISOVUE-370) INJECTION 76% COMPARISON:  Chest radiograph 05/17/2017 FINDINGS: CTA CHEST FINDINGS Cardiovascular: No accentuated density along the aortic or branch vasculature suggest acute intramural hematoma on the noncontrast images. No thoracic aortic dissection is observed. Although today' s exam was protocol to assess the aorta and systemic arterial vasculature, there is enough pulmonary arterial contrast to indicate that there are no large or medium-sized pulmonary emboli. No significant atherosclerotic vascular calcification in the chest. No cardiomegaly or pericardial effusion. Mediastinum/Nodes: No adenopathy noted. Minimal thymic remnant. Esophagus unremarkable. Lungs/Pleura: 3 mm left upper lobe nodule on image 69/8, highly likely to be benign/ incidental in this age group. Suspected subsegmental atelectasis in the superior segment right lower lobe, dependent location. Musculoskeletal: Unremarkable Review of the MIP images confirms the above findings. CTA ABDOMEN AND  PELVIS FINDINGS VASCULAR Aorta: Unremarkable Celiac: Unremarkable ; conventional hepatic arterial anatomy branching. SMA: Unremarkable Renals: Single bilateral renal arteries appear unremarkable. IMA: Unremarkable Inflow: Unremarkable Veins: Unremarkable Review of the MIP images confirms the above findings. NON-VASCULAR Hepatobiliary: Hepatic steatosis with some fatty sparing around the gallbladder fossa. Otherwise normal arterial phase appearance the liver. Pancreas: Unremarkable Spleen: Unremarkable Adrenals/Urinary Tract: Unremarkable Stomach/Bowel: Unremarkable Lymphatic: Left external iliac node 0.9 cm in short axis, upper normal size, image 94/9. No overtly pathologic adenopathy. Reproductive:  Unremarkable Other: No supplemental non-categorized findings. Musculoskeletal: Small direct left inguinal hernia contains adipose tissue. Review of the MIP images confirms the above findings. IMPRESSION: 1. No findings of dissection or acute vascular abnormality. A specific cause for the patient's left chest pain and shortness of breath is not identified. 2. Hepatic steatosis. 3. Small direct left inguinal hernia contains adipose tissue. Electronically Signed   By: Gaylyn Rong M.D.   On: 05/17/2017 07:41    ____________________________________________   PROCEDURES  Procedure(s) performed: None  Procedures  Critical Care performed: No  ____________________________________________   INITIAL IMPRESSION / ASSESSMENT AND PLAN / ED COURSE  As part of my medical decision making, I reviewed the following data within the electronic MEDICAL RECORD NUMBER Notes from prior ED visits and [place] Controlled Substance Database   This is a 31 year old male who comes into the hospital today with some chest pain.  Differential diagnosis includes acute coronary syndrome, pneumonia, pulmonary embolism, aortic dissection.  We did check some blood work and it was found that the patient's troponin was elevated to 2.54.  I will send the patient for a CT angio of his chest he received a chest x-ray which was also negative.  The patient received a dose of morphine and Zofran.  He will be reassessed.  Give the patient some aspirin and some Lovenox.  He will be admitted to the hospitalist service.      ____________________________________________   FINAL CLINICAL IMPRESSION(S) / ED DIAGNOSES  Final diagnoses:  Chest pain, unspecified type  Elevated troponin     ED Discharge Orders    None       Note:  This document was prepared using Dragon voice recognition software and may include unintentional dictation errors.    Rebecka Apley, MD 05/17/17 [phone]

## 2017-05-17 NOTE — Consult Note (Signed)
Craig Guerra is a 31 y.o. male  [id number] [id number]  Primary Cardiologist: New patient to Dr. Adrian BlackwaterShaukat Aldan Camey Reason for Consultation: Chest pain and elevated troponin  HPI: 31yo white male with a past medical history of obesity who presented to the ER with chest pain and was found to have elevated troponin of 2.7. He recently had streptococcal URI treated with Augmentin which made him vomit. He has a family history of CAD, but no personal history of HTN, HLP, or CAD. He reports experiencing significant snoring and exhibits central obesity which would suggest that a outpatient sleep study would be appropriate.    Review of Systems: Central burning chest pain that is worse with movement and palpation of sternum. Reports some mild shortness of breath.    Past Medical History:  Diagnosis Date  . Obesity     Medications Prior to Admission  Medication Sig Dispense Refill  . albuterol (PROVENTIL HFA;VENTOLIN HFA) 108 (90 Base) MCG/ACT inhaler Inhale 2 puffs into the lungs every 6 (six) hours as needed for wheezing or shortness of breath. 1 Inhaler 2  . ibuprofen (ADVIL,MOTRIN) 600 MG tablet Take 1 tablet (600 mg total) by mouth every 8 (eight) hours as needed. (Patient taking differently: Take 600 mg by mouth every 8 (eight) hours as needed for mild pain. ) 30 tablet 0  . magic mouthwash w/lidocaine SOLN Swish and spit 5 mLs every 6 (six) hours as needed for pain.       Marland Kitchen. aspirin EC  81 mg Oral Daily  . docusate sodium  100 mg Oral BID  . enoxaparin (LOVENOX) injection  1 mg/kg Subcutaneous Q12H  . [START ON 05/18/2017] Influenza vac split quadrivalent PF  0.5 mL Intramuscular Tomorrow-1000  . metoprolol tartrate  25 mg Oral BID    Infusions: . sodium chloride 125 mL/hr at 05/17/17 0951    No Known Allergies  Social History   Socioeconomic History  . Marital status: Married    Spouse name: Not on file  . Number of children: Not on file  . Years of education: Not on file  . Highest  education level: Not on file  Social Needs  . Financial resource strain: Not on file  . Food insecurity - worry: Not on file  . Food insecurity - inability: Not on file  . Transportation needs - medical: Not on file  . Transportation needs - non-medical: Not on file  Occupational History  . Not on file  Tobacco Use  . Smoking status: Never Smoker  . Smokeless tobacco: Never Used  Substance and Sexual Activity  . Alcohol use: Yes    Comment: on very rare occasion   . Drug use: No  . Sexual activity: Not on file  Other Topics Concern  . Not on file  Social History Narrative  . Not on file    Family History  Problem Relation Age of Onset  . Stroke Mother     PHYSICAL EXAM: Vitals:   05/17/17 0950 05/17/17 0958  BP: 139/89 107/72  Pulse: 96 (!) 103  Resp:    Temp:    SpO2:      No intake or output data in the 24 hours ending 05/17/17 1032  General:  Mild shortness of breath HEENT: normal Neck: supple. no JVD. Carotids 2+ bilat; no bruits. No lymphadenopathy or thryomegaly appreciated. Cardiac: Exhibits chest wall tenderness to palpation. Lungs: clear Abdomen: soft, nontender, nondistended. No hepatosplenomegaly. No bruits or masses. Good bowel sounds. Extremities: no cyanosis,  clubbing, rash, edema Neuro: alert & oriented x 3, cranial nerves grossly intact. moves all 4 extremities w/o difficulty. Affect pleasant.  ECG: Sinus tachycardia 102bpm  Results for orders placed or performed during the hospital encounter of 05/17/17 (from the past 24 hour(s))  Basic metabolic panel     Status: Abnormal   Collection Time: 05/17/17  4:56 AM  Result Value Ref Range   Sodium 137 135 - 145 mmol/L   Potassium 4.1 3.5 - 5.1 mmol/L   Chloride 103 101 - 111 mmol/L   CO2 26 22 - 32 mmol/L   Glucose, Bld 112 (H) 65 - 99 mg/dL   BUN 13 6 - 20 mg/dL   Creatinine, Ser 6.210.84 0.61 - 1.24 mg/dL   Calcium 9.3 8.9 - 30.810.3 mg/dL   GFR calc non Af Amer >60 >60 mL/min   GFR calc Af Amer  >60 >60 mL/min   Anion gap 8 5 - 15  CBC     Status: Abnormal   Collection Time: 05/17/17  4:56 AM  Result Value Ref Range   WBC 17.0 (H) 3.8 - 10.6 K/uL   RBC 5.57 4.40 - 5.90 MIL/uL   Hemoglobin 15.7 13.0 - 18.0 g/dL   HCT 65.746.2 84.640.0 - 96.252.0 %   MCV 83.0 80.0 - 100.0 fL   MCH 28.1 26.0 - 34.0 pg   MCHC 33.9 32.0 - 36.0 g/dL   RDW 95.213.3 84.111.5 - 32.414.5 %   Platelets 375 150 - 440 K/uL  Troponin I     Status: Abnormal   Collection Time: 05/17/17  4:56 AM  Result Value Ref Range   Troponin I 2.54 (HH) <0.03 ng/mL  Troponin I     Status: Abnormal   Collection Time: 05/17/17  7:35 AM  Result Value Ref Range   Troponin I 2.74 (HH) <0.03 ng/mL   Dg Chest 2 View  Result Date: 05/17/2017 CLINICAL DATA:  Left-sided chest pain. EXAM: CHEST  2 VIEW COMPARISON:  07/29/2016 FINDINGS: The cardiomediastinal contours are normal. The lungs are clear. Pulmonary vasculature is normal. No consolidation, pleural effusion, or pneumothorax. No acute osseous abnormalities are seen. IMPRESSION: No acute pulmonary process. Electronically Signed   By: Rubye OaksMelanie  Ehinger M.D.   On: 05/17/2017 05:31   Ct Angio Chest/abd/pel For Dissection W And/or Wo Contrast  Result Date: 05/17/2017 CLINICAL DATA:  Left chest pain starting yesterday afternoon come extending into the neck. Shortness of breath. Elevated troponin. EXAM: CT ANGIOGRAPHY CHEST, ABDOMEN AND PELVIS TECHNIQUE: Multidetector CT imaging through the chest, abdomen and pelvis was performed using the standard protocol during bolus administration of intravenous contrast. Multiplanar reconstructed images and MIPs were obtained and reviewed to evaluate the vascular anatomy. CONTRAST:  100mL ISOVUE-370 IOPAMIDOL (ISOVUE-370) INJECTION 76% COMPARISON:  Chest radiograph 05/17/2017 FINDINGS: CTA CHEST FINDINGS Cardiovascular: No accentuated density along the aortic or branch vasculature suggest acute intramural hematoma on the noncontrast images. No thoracic aortic  dissection is observed. Although today' s exam was protocol to assess the aorta and systemic arterial vasculature, there is enough pulmonary arterial contrast to indicate that there are no large or medium-sized pulmonary emboli. No significant atherosclerotic vascular calcification in the chest. No cardiomegaly or pericardial effusion. Mediastinum/Nodes: No adenopathy noted. Minimal thymic remnant. Esophagus unremarkable. Lungs/Pleura: 3 mm left upper lobe nodule on image 69/8, highly likely to be benign/ incidental in this age group. Suspected subsegmental atelectasis in the superior segment right lower lobe, dependent location. Musculoskeletal: Unremarkable Review of the MIP images confirms the above findings.  CTA ABDOMEN AND PELVIS FINDINGS VASCULAR Aorta: Unremarkable Celiac: Unremarkable ; conventional hepatic arterial anatomy branching. SMA: Unremarkable Renals: Single bilateral renal arteries appear unremarkable. IMA: Unremarkable Inflow: Unremarkable Veins: Unremarkable Review of the MIP images confirms the above findings. NON-VASCULAR Hepatobiliary: Hepatic steatosis with some fatty sparing around the gallbladder fossa. Otherwise normal arterial phase appearance the liver. Pancreas: Unremarkable Spleen: Unremarkable Adrenals/Urinary Tract: Unremarkable Stomach/Bowel: Unremarkable Lymphatic: Left external iliac node 0.9 cm in short axis, upper normal size, image 94/9. No overtly pathologic adenopathy. Reproductive: Unremarkable Other: No supplemental non-categorized findings. Musculoskeletal: Small direct left inguinal hernia contains adipose tissue. Review of the MIP images confirms the above findings. IMPRESSION: 1. No findings of dissection or acute vascular abnormality. A specific cause for the patient's left chest pain and shortness of breath is not identified. 2. Hepatic steatosis. 3. Small direct left inguinal hernia contains adipose tissue. Electronically Signed   By: Gaylyn Rong M.D.   On:  05/17/2017 07:41     ASSESSMENT AND PLAN: Atypical chest pain with elevated troponin most likely due to myopericarditis with history of recent streptococcal upper respiratory tract infection and reaction to antibiotics. Advise Echocardiogram as soon as possible to evaluate for pericardial effusion. If the pt continues to have chest pain will do cardiac cath.  Sinus tachycardia noted. If not improved with pain control consider metoprolol.  Echo showed normal LVEF with dilated RV/RA. CTA chest showed no obvious pulmonary embolism and no acute st changes on EKG. If he continues to have chest pain will do cath or do CCTA as out patient.  Caroleen Hamman, NP-C Page: [phone]

## 2017-05-17 NOTE — ED Notes (Signed)
Pt taken to treatment room 7 via wheelchair by ED tech Alissa; verbal [facility],RN and Dr Zenda AlpersWebster; both informed of pt's elevated troponin 2.54

## 2017-05-17 NOTE — Progress Notes (Signed)
*  PRELIMINARY RESULTS* Echocardiogram 2D Echocardiogram has been performed.  Cristela BlueHege, Yossef Gilkison 05/17/2017, 12:28 PM

## 2017-05-17 NOTE — ED Notes (Signed)
Called to give report to West PittstonErica, Charity fundraiserN, room 249 nurse. RN in another pts room and will return the call in 5 min.

## 2017-05-17 NOTE — Progress Notes (Signed)
31 yo wm admitted to room 249 via stretcher from ED with NSTEMI.  A&O x 3, gait steady.  No distress on ra.  Cardiac monitor applied to pt and verified with Rea CollegeJessica RN.  Pt states chest pain 3/10 at this time.  Lungs clear bil.  Skin intact, multiple tatoos noted.  SL rt upper arm flushes well.  Oriented to room and surroundings, POC reviewed with pt and wife.  CB in reach, SR up x 2.

## 2017-05-18 ENCOUNTER — Encounter: Payer: Self-pay | Admitting: *Deleted

## 2017-05-18 ENCOUNTER — Encounter: Payer: Self-pay | Admitting: Certified Registered Nurse Anesthetist

## 2017-05-18 ENCOUNTER — Encounter
Admission: EM | Disposition: A | Discharge status: Home / Self Care | Payer: Self-pay | Source: Home / Self Care | Attending: Specialist

## 2017-05-18 HISTORY — PX: LEFT HEART CATH AND CORONARY ANGIOGRAPHY: CATH118249

## 2017-05-18 LAB — HEMOGLOBIN A1C
Hgb A1c MFr Bld: 5.5 % (ref 4.8–5.6)
Mean Plasma Glucose: 111 mg/dL

## 2017-05-18 LAB — TROPONIN I: Troponin I: 5.61 ng/mL (ref <0.03)

## 2017-05-18 LAB — LIPID PANEL
CHOL/HDL RATIO: 5.4 ratio
Cholesterol: 194 mg/dL (ref 0–200)
HDL: 36 mg/dL — ABNORMAL LOW (ref >40)
LDL CALC: 122 mg/dL — AB (ref 0–99)
Triglycerides: 178 mg/dL — ABNORMAL HIGH (ref <150)
VLDL: 36 mg/dL (ref 0–40)

## 2017-05-18 SURGERY — LEFT HEART CATH AND CORONARY ANGIOGRAPHY
Anesthesia: Moderate Sedation | Laterality: Right

## 2017-05-18 MED ORDER — SODIUM CHLORIDE 0.9% FLUSH: 3.0000 mL | INTRAVENOUS | Status: DC | PRN

## 2017-05-18 MED ORDER — SODIUM CHLORIDE 0.9% FLUSH: 3.0000 mL | Freq: Two times a day (BID) | INTRAVENOUS | Status: DC

## 2017-05-18 MED ORDER — ACETAMINOPHEN 325 MG PO TABS: 650.0000 mg | ORAL_TABLET | ORAL | Status: DC | PRN

## 2017-05-18 MED ORDER — MIDAZOLAM HCL 2 MG/2ML IJ SOLN
INTRAMUSCULAR | Status: DC | PRN
  Administered 2017-05-18 (×2): 1 mg via INTRAVENOUS

## 2017-05-18 MED ORDER — SODIUM CHLORIDE 0.9 % WEIGHT BASED INFUSION: 1.0000 mL/kg/h | INTRAVENOUS | Status: DC

## 2017-05-18 MED ORDER — SODIUM CHLORIDE 0.9 % IV SOLN: 250.0000 mL | INTRAVENOUS | Status: DC | PRN

## 2017-05-18 MED ORDER — MIDAZOLAM HCL 2 MG/2ML IJ SOLN
INTRAMUSCULAR | Status: AC
  Filled 2017-05-18: qty 2

## 2017-05-18 MED ORDER — SODIUM CHLORIDE 0.9 % WEIGHT BASED INFUSION
1.0000 mL/kg/h | INTRAVENOUS | Status: DC
  Administered 2017-05-18: 1 mL/kg/h via INTRAVENOUS

## 2017-05-18 MED ORDER — FENTANYL CITRATE (PF) 100 MCG/2ML IJ SOLN
INTRAMUSCULAR | Status: DC | PRN
  Administered 2017-05-18: 50 ug via INTRAVENOUS

## 2017-05-18 MED ORDER — ASPIRIN 81 MG PO CHEW: 81.0000 mg | CHEWABLE_TABLET | ORAL | Status: DC

## 2017-05-18 MED ORDER — HEPARIN (PORCINE) IN NACL 2-0.9 UNIT/ML-% IJ SOLN
INTRAMUSCULAR | Status: AC
  Filled 2017-05-18: qty 500

## 2017-05-18 MED ORDER — ONDANSETRON HCL 4 MG/2ML IJ SOLN: 4.0000 mg | Freq: Four times a day (QID) | INTRAMUSCULAR | Status: DC | PRN

## 2017-05-18 MED ORDER — TRAMADOL HCL 50 MG PO TABS: 50.0000 mg | ORAL_TABLET | Freq: Four times a day (QID) | ORAL | 0 refills | Status: DC | PRN

## 2017-05-18 MED ORDER — FENTANYL CITRATE (PF) 100 MCG/2ML IJ SOLN
INTRAMUSCULAR | Status: AC
  Filled 2017-05-18: qty 2

## 2017-05-18 MED ORDER — ACETAMINOPHEN 325 MG PO TABS: 650.0000 mg | ORAL_TABLET | Freq: Four times a day (QID) | ORAL | Status: DC | PRN

## 2017-05-18 MED ORDER — ACETAMINOPHEN 650 MG RE SUPP: 650.0000 mg | Freq: Four times a day (QID) | RECTAL | Status: DC | PRN

## 2017-05-18 MED ORDER — IOPAMIDOL (ISOVUE-300) INJECTION 61%
INTRAVENOUS | Status: DC | PRN
  Administered 2017-05-18: 110 mL via INTRA_ARTERIAL

## 2017-05-18 MED ORDER — SODIUM CHLORIDE 0.9 % WEIGHT BASED INFUSION
3.0000 mL/kg/h | INTRAVENOUS | Status: DC
  Administered 2017-05-18: 3 mL/kg/h via INTRAVENOUS

## 2017-05-18 SURGICAL SUPPLY — 8 items
CATH 5FR JR4 DIAGNOSTIC (CATHETERS) ×2 IMPLANT
CATH INFINITI 5FR ANG PIGTAIL (CATHETERS) ×2 IMPLANT
CATH INFINITI 5FR JL4 (CATHETERS) ×2 IMPLANT
GUIDEWIRE 3MM J TIP .035 145 (WIRE) ×4 IMPLANT
KIT MANI 3VAL PERCEP (MISCELLANEOUS) ×2 IMPLANT
NEEDLE PERC 18GX7CM (NEEDLE) ×2 IMPLANT
PACK CARDIAC CATH (CUSTOM PROCEDURE TRAY) ×2 IMPLANT
SHEATH PINNACLE 5F 10CM (SHEATH) ×2 IMPLANT

## 2017-05-18 NOTE — Plan of Care (Signed)
Ready for discharge, able to provide care.

## 2017-05-18 NOTE — Progress Notes (Signed)
Troponin level 5.61. MD Sheryle Hailiamond made aware. Pt still c/o chest pain 5/10 that is relieved by SL nitro. Nitro paste applied to left chest. PRN morphine given. Will continue to monitor.

## 2017-05-18 NOTE — Discharge Summary (Signed)
[facility] [place] [facility]   PATIENT NAME: Craig FreezeKevin Guerra    MR#:  [id number]  DATE OF BIRTH:  [birth year]  DATE OF ADMISSION:  05/17/2017 ADMITTING PHYSICIAN: Arnaldo NatalMichael S Diamond, MD  DATE OF DISCHARGE: 05/18/2017  2:53 PM  PRIMARY CARE PHYSICIAN: Gabriel CirriWicker, Cheryl, NP    ADMISSION DIAGNOSIS:  Elevated troponin [R74.8] Chest pain, unspecified type [R07.9]  DISCHARGE DIAGNOSIS:  Active Problems:   NSTEMI (non-ST elevated myocardial infarction) (HCC)   SECONDARY DIAGNOSIS:   Past Medical History:  Diagnosis Date  . Obesity     HOSPITAL COURSE:   31 year old male with past medical history of obesity, obstructive sleep apnea, resistance to the hospital due to chest pain and noted to have an elevated troponin.  1. Non-ST elevation MI-patient presented to the hospital with chest pain and noted to have significantly elevated cardiac markers consistent with non-ST elevation MI. His EKG although showed no acute ST or T-wave changes. -Patient was seen by cardiology and underwent a cardiac catheterization on 05/18/2017 which showed no significant coronary artery disease. Non-ST elevation MI therefore has been ruled out. Patient's elevated troponin was likely secondary to acute myocarditis. Patient had no evidence of congestive heart failure or any arrhythmias. He was therefore discharged home on supportive care with pain control.  -Patient will follow-up with cardiology as an outpatient next week.   2. Obstructive sleep apnea-patient has suspected sleep apnea. He needs to have an outpatient sleep study done. He will follow up with his primary care physician to get that arranged.  3. Hypertension- pt. Is not on any meds and will follow up with PCP to get meds started.    DISCHARGE CONDITIONS:   Stable.   CONSULTS OBTAINED:  Treatment Team:  Laurier NancyKhan, Shaukat A, MD  DRUG ALLERGIES:  No Known Allergies  DISCHARGE MEDICATIONS:   Allergies as of 05/18/2017    No Known Allergies     Medication List    TAKE these medications   albuterol 108 (90 Base) MCG/ACT inhaler Commonly known as:  PROVENTIL HFA;VENTOLIN HFA Inhale 2 puffs into the lungs every 6 (six) hours as needed for wheezing or shortness of breath.   ibuprofen 600 MG tablet Commonly known as:  ADVIL,MOTRIN Take 1 tablet (600 mg total) by mouth every 8 (eight) hours as needed. What changed:  reasons to take this   magic mouthwash w/lidocaine Soln Swish and spit 5 mLs every 6 (six) hours as needed for pain.   traMADol 50 MG tablet Commonly known as:  ULTRAM Take 1 tablet (50 mg total) by mouth every 6 (six) hours as needed.         DISCHARGE INSTRUCTIONS:   DIET:  Cardiac diet  DISCHARGE CONDITION:  Stable  ACTIVITY:  Activity as tolerated  OXYGEN:  Home Oxygen: No.   Oxygen Delivery: room air  DISCHARGE LOCATION:  home   If you experience worsening of your admission symptoms, develop shortness of breath, life threatening emergency, suicidal or homicidal thoughts you must seek medical attention immediately by calling 911 or calling your MD immediately  if symptoms less severe.  You Must read complete instructions/literature along with all the possible adverse reactions/side effects for all the Medicines you take and that have been prescribed to you. Take any new Medicines after you have completely understood and accpet all the possible adverse reactions/side effects.   Please note  You were cared for by a hospitalist during your hospital stay. If you have any questions about your discharge  medications or the care you received while you were in the hospital after you are discharged, you can call the unit and asked to speak with the hospitalist on call if the hospitalist that took care of you is not available. Once you are discharged, your primary care physician will handle any further medical issues. Please note that NO REFILLS for any discharge medications will  be authorized once you are discharged, as it is imperative that you return to your primary care physician (or establish a relationship with a primary care physician if you do not have one) for your aftercare needs so that they can reassess your need for medications and monitor your lab values.     Today   Still having Chest pain.  Cardiac Cath was (-). Will d/c home and follow up with Cardiology.   VITAL SIGNS:  Blood pressure (!) 153/82, pulse 94, temperature 98.1 F (36.7 C), temperature source Oral, resp. rate 12, height 5\' 9"  (1.753 m), weight 125.8 kg (277 lb 6.4 oz), SpO2 97 %.  I/O:    Intake/Output Summary (Last 24 hours) at 05/18/2017 1736 Last data filed at 05/18/2017 1239 Gross per 24 hour  Intake 2427.92 ml  Output 725 ml  Net 1702.92 ml    PHYSICAL EXAMINATION:  GENERAL:  31 y.o.-year-old obese patient lying in the bed with no acute distress.  EYES: Pupils equal, round, reactive to light and accommodation. No scleral icterus. Extraocular muscles intact.  HEENT: Head atraumatic, normocephalic. Oropharynx and nasopharynx clear.  NECK:  Supple, no jugular venous distention. No thyroid enlargement, no tenderness.  LUNGS: Normal breath sounds bilaterally, no wheezing, rales,rhonchi. No use of accessory muscles of respiration.  CARDIOVASCULAR: S1, S2 normal. No murmurs, rubs, or gallops.  ABDOMEN: Soft, non-tender, non-distended. Bowel sounds present. No organomegaly or mass.  EXTREMITIES: No pedal edema, cyanosis, or clubbing.  NEUROLOGIC: Cranial nerves II through XII are intact. No focal motor or sensory defecits b/l.  PSYCHIATRIC: The patient is alert and oriented x 3.  SKIN: No obvious rash, lesion, or ulcer.   DATA REVIEW:   CBC Recent Labs  Lab 05/17/17 0456  WBC 17.0*  HGB 15.7  HCT 46.2  PLT 375    Chemistries  Recent Labs  Lab 05/17/17 0456  NA 137  K 4.1  CL 103  CO2 26  GLUCOSE 112*  BUN 13  CREATININE 0.84  CALCIUM 9.3    Cardiac  Enzymes Recent Labs  Lab 05/18/17 0301  TROPONINI 5.61*    Microbiology Results  Results for orders placed or performed in visit on 07/29/16  Rapid strep screen (not at [facility])     Status: None   Collection Time: 07/29/16 10:14 AM  Result Value Ref Range Status   Strep Gp A Ag, IA W/Reflex Negative Negative Final  Culture, Group A Strep     Status: Abnormal   Collection Time: 07/29/16 10:14 AM  Result Value Ref Range Status   Strep A Culture Comment (A)  Final    Comment: Beta-hemolytic colonies, not group A Streptococcus isolated.    RADIOLOGY:  Dg Chest 2 View  Result Date: 05/17/2017 CLINICAL DATA:  Left-sided chest pain. EXAM: CHEST  2 VIEW COMPARISON:  07/29/2016 FINDINGS: The cardiomediastinal contours are normal. The lungs are clear. Pulmonary vasculature is normal. No consolidation, pleural effusion, or pneumothorax. No acute osseous abnormalities are seen. IMPRESSION: No acute pulmonary process. Electronically Signed   By: Rubye Oaks M.D.   On: 05/17/2017 05:31   Ct Angio Chest/abd/pel  For Dissection W And/or Wo Contrast  Result Date: 05/17/2017 CLINICAL DATA:  Left chest pain starting yesterday afternoon come extending into the neck. Shortness of breath. Elevated troponin. EXAM: CT ANGIOGRAPHY CHEST, ABDOMEN AND PELVIS TECHNIQUE: Multidetector CT imaging through the chest, abdomen and pelvis was performed using the standard protocol during bolus administration of intravenous contrast. Multiplanar reconstructed images and MIPs were obtained and reviewed to evaluate the vascular anatomy. CONTRAST:  100mL ISOVUE-370 IOPAMIDOL (ISOVUE-370) INJECTION 76% COMPARISON:  Chest radiograph 05/17/2017 FINDINGS: CTA CHEST FINDINGS Cardiovascular: No accentuated density along the aortic or branch vasculature suggest acute intramural hematoma on the noncontrast images. No thoracic aortic dissection is observed. Although today' s exam was protocol to assess the aorta and systemic arterial  vasculature, there is enough pulmonary arterial contrast to indicate that there are no large or medium-sized pulmonary emboli. No significant atherosclerotic vascular calcification in the chest. No cardiomegaly or pericardial effusion. Mediastinum/Nodes: No adenopathy noted. Minimal thymic remnant. Esophagus unremarkable. Lungs/Pleura: 3 mm left upper lobe nodule on image 69/8, highly likely to be benign/ incidental in this age group. Suspected subsegmental atelectasis in the superior segment right lower lobe, dependent location. Musculoskeletal: Unremarkable Review of the MIP images confirms the above findings. CTA ABDOMEN AND PELVIS FINDINGS VASCULAR Aorta: Unremarkable Celiac: Unremarkable ; conventional hepatic arterial anatomy branching. SMA: Unremarkable Renals: Single bilateral renal arteries appear unremarkable. IMA: Unremarkable Inflow: Unremarkable Veins: Unremarkable Review of the MIP images confirms the above findings. NON-VASCULAR Hepatobiliary: Hepatic steatosis with some fatty sparing around the gallbladder fossa. Otherwise normal arterial phase appearance the liver. Pancreas: Unremarkable Spleen: Unremarkable Adrenals/Urinary Tract: Unremarkable Stomach/Bowel: Unremarkable Lymphatic: Left external iliac node 0.9 cm in short axis, upper normal size, image 94/9. No overtly pathologic adenopathy. Reproductive: Unremarkable Other: No supplemental non-categorized findings. Musculoskeletal: Small direct left inguinal hernia contains adipose tissue. Review of the MIP images confirms the above findings. IMPRESSION: 1. No findings of dissection or acute vascular abnormality. A specific cause for the patient's left chest pain and shortness of breath is not identified. 2. Hepatic steatosis. 3. Small direct left inguinal hernia contains adipose tissue. Electronically Signed   By: Gaylyn RongWalter  Liebkemann M.D.   On: 05/17/2017 07:41      Management plans discussed with the patient, family and they are in  agreement.  CODE STATUS:     Code Status Orders  (From admission, onward)        Start     Ordered   05/17/17 0938  Full code  Continuous     05/17/17 0937    Code Status History    Date Active Date Inactive Code Status Order ID Comments User Context   This patient has a current code status but no historical code status.      TOTAL TIME TAKING CARE OF THIS PATIENT: 40 minutes.    [place] SirenSAINANI,VIVEK J M.D on 05/18/2017 at 5:36 PM  Between 7am to 6pm - Pager - [phone]  After 6pm go to [email or web address] - Scientist, research (life sciences)password EPAS [facility]  [facility] [place] [facility]  Office  [phone]  CC: Primary care physician; Gabriel CirriWicker, Cheryl, NP

## 2017-05-18 NOTE — Progress Notes (Signed)
SUBJECTIVE: Patient still has chest pain   Vitals:   05/18/17 0533 05/18/17 0539 05/18/17 0739 05/18/17 0806  BP: 119/68 119/68 112/67 125/76  Pulse: 90 98 94 (!) 101  Resp:   14 20  Temp:   98.1 F (36.7 C)   TempSrc:   Oral   SpO2:   94% 95%  Weight:      Height:        Intake/Output Summary (Last 24 hours) at 05/18/2017 0914 Last data filed at 05/18/2017 0630 Gross per 24 hour  Intake 3301.25 ml  Output 0 ml  Net 3301.25 ml    LABS: Basic Metabolic Panel: Recent Labs    05/17/17 0456  NA 137  K 4.1  CL 103  CO2 26  GLUCOSE 112*  BUN 13  CREATININE 0.84  CALCIUM 9.3   Liver Function Tests: No results for input(s): AST, ALT, ALKPHOS, BILITOT, PROT, ALBUMIN in the last 72 hours. No results for input(s): LIPASE, AMYLASE in the last 72 hours. CBC: Recent Labs    05/17/17 0456  WBC 17.0*  HGB 15.7  HCT 46.2  MCV 83.0  PLT 375   Cardiac Enzymes: Recent Labs    05/17/17 1541 05/17/17 2153 05/18/17 0301  TROPONINI 2.87* 4.66* 5.61*   BNP: Invalid input(s): POCBNP D-Dimer: No results for input(s): DDIMER in the last 72 hours. Hemoglobin A1C: Recent Labs    05/17/17 1005  HGBA1C 5.5   Fasting Lipid Panel: Recent Labs    05/18/17 0301  CHOL 194  HDL 36*  LDLCALC 122*  TRIG 178*  CHOLHDL 5.4   Thyroid Function Tests: Recent Labs    05/17/17 1005  TSH 1.247   Anemia Panel: No results for input(s): VITAMINB12, FOLATE, FERRITIN, TIBC, IRON, RETICCTPCT in the last 72 hours.   PHYSICAL EXAM General: Well developed, well nourished, in no acute distress HEENT:  Normocephalic and atramatic Neck:  No JVD.  Lungs: Clear bilaterally to auscultation and percussion. Heart: HRRR . Normal S1 and S2 without gallops or murmurs.  Abdomen: Bowel sounds are positive, abdomen soft and non-tender  Msk:  Back normal, normal gait. Normal strength and tone for age. Extremities: No clubbing, cyanosis or edema.   Neuro: Alert and oriented X 3. Psych:   Good affect, responds appropriately  TELEMETRY: Sinus rhythm  ASSESSMENT AND PLAN: Non-STEMI with troponin trending up thus going to have cardiac catheterization this morning. Risk and benefits were explained to patient and patient has agreed to the procedure.  Active Problems:   NSTEMI (non-ST elevated myocardial infarction) (HCC)    Craig BlackwaterKHAN,Teller Wakefield A, MD, [facility] 05/18/2017 9:14 AM

## 2017-05-18 NOTE — Progress Notes (Signed)
[facility]    Drenda FreezeKevin Mcneece was admitted to the Hospital on 05/17/2017 and Discharged  05/18/2017 and should be excused from work/school   for 7 days starting 05/17/2017 , may return to work/school without any restrictions.  Call Hilda LiasVivek Sainani MD, [facility]  [phone] with questions.  [place] SirenSAINANI,VIVEK J M.D on 05/18/2017,at 2:43 PM

## 2017-05-18 NOTE — Procedures (Signed)
Normal coronaries and normal LVEF. Chest pain and elevated troponins due to myocarditis, may go home with f/u Monday 10 am.

## 2017-05-21 ENCOUNTER — Encounter: Payer: Self-pay | Admitting: Unknown Physician Specialty

## 2017-05-21 ENCOUNTER — Ambulatory Visit: Payer: BLUE CROSS/BLUE SHIELD | Admitting: Unknown Physician Specialty

## 2017-05-21 VITALS — BP 126/84 | HR 111 | Temp 98.4°F | Wt 275.4 lb

## 2017-05-21 DIAGNOSIS — I41 Myocarditis in diseases classified elsewhere: Secondary | ICD-10-CM | POA: Diagnosis not present

## 2017-05-21 DIAGNOSIS — J181 Lobar pneumonia, unspecified organism: Secondary | ICD-10-CM | POA: Diagnosis not present

## 2017-05-21 DIAGNOSIS — G4733 Obstructive sleep apnea (adult) (pediatric): Secondary | ICD-10-CM | POA: Diagnosis not present

## 2017-05-21 DIAGNOSIS — J029 Acute pharyngitis, unspecified: Secondary | ICD-10-CM | POA: Diagnosis not present

## 2017-05-21 DIAGNOSIS — G4730 Sleep apnea, unspecified: Secondary | ICD-10-CM | POA: Diagnosis not present

## 2017-05-21 DIAGNOSIS — J020 Streptococcal pharyngitis: Secondary | ICD-10-CM

## 2017-05-21 DIAGNOSIS — R0602 Shortness of breath: Secondary | ICD-10-CM | POA: Diagnosis not present

## 2017-05-21 DIAGNOSIS — R079 Chest pain, unspecified: Secondary | ICD-10-CM | POA: Diagnosis not present

## 2017-05-21 DIAGNOSIS — J189 Pneumonia, unspecified organism: Secondary | ICD-10-CM

## 2017-05-21 DIAGNOSIS — K210 Gastro-esophageal reflux disease with esophagitis: Secondary | ICD-10-CM | POA: Diagnosis not present

## 2017-05-21 LAB — RAPID STREP SCREEN (MED CTR MEBANE ONLY): Strep Gp A Ag, IA W/Reflex: POSITIVE — AB

## 2017-05-21 MED ORDER — AZITHROMYCIN 250 MG PO TABS: ORAL_TABLET | ORAL | 0 refills | Status: DC

## 2017-05-21 MED ORDER — AMOXICILLIN 875 MG PO TABS: 875.0000 mg | ORAL_TABLET | Freq: Two times a day (BID) | ORAL | 0 refills | Status: DC

## 2017-05-21 NOTE — Progress Notes (Signed)
BP 126/84   Pulse (!) 111   Temp 98.4 F (36.9 C) (Oral)   Wt 275 lb 6.4 oz (124.9 kg)   SpO2 94%   BMI 40.67 kg/m    Subjective:    Patient ID: Craig Guerra, male    DOB: [birth year], 31 y.o.   MRN: [id number]  HPI: Craig Guerra is a 31 y.o. male  Chief Complaint  Patient presents with  . Hospitalization Follow-up  . Sore Throat    pt states his throat is extrememly sore, states he had strep 2 weeks ago    Hospital f/u Pt with history of Obesity was admitted 12/17 for suspected MI due to chest pain and elevated tropinin levels.  Cardiac cath was negative and thought he had acute myocarditis of unknown cause but they think maybe strep.  No CHF. Saw cardilogist    Ten days before he went to the hospital, was diagnosed by strep and given Augmentin but only taken for 3 days as it cause vomiting and diarrhea.  Not treated for strep in the hospital. He is able to swallow  Sleep apnea Suspected.  Cardiologist set him up for the sleep study.  Stopped breathing in pre-op.  Sometimes falls asleep during the day.  Does not feel well rested in the AM  URI   This is a new problem. Episode onset: 3 days after getting out of hospital. The problem has been gradually worsening. There has been no fever. Associated symptoms include chest pain, congestion, ear pain, headaches, joint pain, rhinorrhea, sinus pain, sneezing and a sore throat. Pertinent negatives include no abdominal pain, coughing, diarrhea, dysuria, nausea, neck pain or wheezing. He has tried nothing for the symptoms.   Relevant past medical, surgical, family and social history reviewed and updated as indicated. Interim medical history since our last visit reviewed. Allergies and medications reviewed and updated.  Review of Systems  Constitutional: Positive for diaphoresis.  HENT: Positive for congestion, ear pain, rhinorrhea, sinus pain, sneezing and sore throat.        Jaw pain  Respiratory: Negative for cough and  wheezing.   Cardiovascular: Positive for chest pain.  Gastrointestinal: Negative for abdominal pain, diarrhea and nausea.  Genitourinary: Negative for dysuria.  Musculoskeletal: Positive for joint pain. Negative for neck pain.  Neurological: Positive for headaches.    Per HPI unless specifically indicated above     Objective:    BP 126/84   Pulse (!) 111   Temp 98.4 F (36.9 C) (Oral)   Wt 275 lb 6.4 oz (124.9 kg)   SpO2 94%   BMI 40.67 kg/m   Wt Readings from Last 3 Encounters:  05/21/17 275 lb 6.4 oz (124.9 kg)  05/18/17 277 lb 6.4 oz (125.8 kg)  07/29/16 289 lb 3.2 oz (131.2 kg)    Physical Exam  Constitutional: He is oriented to person, place, and time. He appears well-developed and well-nourished. No distress.  HENT:  Head: Normocephalic and atraumatic.  Right Ear: Tympanic membrane and ear canal normal.  Left Ear: Tympanic membrane and ear canal normal.  Nose: Rhinorrhea present. Right sinus exhibits no maxillary sinus tenderness and no frontal sinus tenderness. Left sinus exhibits no maxillary sinus tenderness and no frontal sinus tenderness.  Mouth/Throat: Uvula is midline. Posterior oropharyngeal edema present.  Eyes: Conjunctivae and lids are normal. Right eye exhibits no discharge. Left eye exhibits no discharge. No scleral icterus.  Neck: Neck supple.  Cardiovascular: Normal rate, regular rhythm and normal heart sounds.  Pulmonary/Chest: Effort normal. No respiratory distress. He has rales in the left lower field.  Abdominal: Normal appearance. There is no splenomegaly or hepatomegaly.  Musculoskeletal: Normal range of motion.  Neurological: He is alert and oriented to person, place, and time.  Skin: Skin is warm, dry and intact. No rash noted. No pallor.  Psychiatric: He has a normal mood and affect. His behavior is normal. Judgment and thought content normal.  Nursing note and vitals reviewed.    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  Took 3 days of Augmentin but not treated while in the hospital   Relevant Medications   azithromycin (ZITHROMAX Z-PAK) 250 MG tablet   Strep pharyngitis       positive for strep.  Augmentin caused vomiting.  Will rx Zithromax   Relevant Medications   azithromycin (ZITHROMAX Z-PAK) 250 MG tablet   Pneumonia of left lower lobe due to infectious organism (HCC)       Rales LLL.  Treat for pneumonia by adding Amoxil.  Had strep for a prolonged period of time.     Relevant Medications   azithromycin (ZITHROMAX Z-PAK) 250 MG tablet   amoxicillin (AMOXIL) 875 MG tablet       Follow up plan: Return if symptoms worsen or fail to improve, for Recheck on Monday.

## 2017-05-21 NOTE — Assessment & Plan Note (Signed)
Sleep study pending 

## 2017-05-24 ENCOUNTER — Encounter: Payer: Self-pay | Admitting: Unknown Physician Specialty

## 2017-05-24 ENCOUNTER — Ambulatory Visit: Payer: BLUE CROSS/BLUE SHIELD | Admitting: Unknown Physician Specialty

## 2017-05-24 VITALS — BP 131/91 | HR 106 | Temp 97.9°F | Wt 268.2 lb

## 2017-05-24 DIAGNOSIS — R5383 Other fatigue: Secondary | ICD-10-CM | POA: Diagnosis not present

## 2017-05-24 DIAGNOSIS — J020 Streptococcal pharyngitis: Secondary | ICD-10-CM

## 2017-05-24 DIAGNOSIS — D72828 Other elevated white blood cell count: Secondary | ICD-10-CM

## 2017-05-24 DIAGNOSIS — J153 Pneumonia due to streptococcus, group B: Secondary | ICD-10-CM | POA: Diagnosis not present

## 2017-05-24 NOTE — Progress Notes (Signed)
BP (!) 131/91   Pulse (!) 106   Temp 97.9 F (36.6 C) (Oral)   Wt 268 lb 3.2 oz (121.7 kg)   SpO2 93%   BMI 39.61 kg/m    Subjective:    Patient ID: Craig Guerra, male    DOB: Aug 22, 1985, 31 y.o.   MRN: [id number]  HPI: Craig Guerra is a [age] y.o. male  Chief Complaint  Patient presents with  . Follow-up    follow up from last week, pt states he is feeling better   Strep ? Strep pneumonia from last visit.  Pt states he feels better with some resolving left sided throat pain.  Gets chills and cold sweats but no fever.  Able to eat solid foods today  Myocarditis I suspect secondary to strep.  Pt is following with cardiology.  Will review those records when I get them.  Some continued chest pain typically relieved by Tylenol.  Given PPI by cardiology. General chest pain trajectory is improved   Sleep apnea Set up by cardiology  Upon chart review:  Last white count noted to be 17,000  Relevant past medical, surgical, family and social history reviewed and updated as indicated. Interim medical history since our last visit reviewed. Allergies and medications reviewed and updated.  Review of Systems  Constitutional: Positive for fatigue. Negative for fever.  Respiratory: Negative.   Gastrointestinal:       Anorexia  Genitourinary: Negative.   Neurological: Negative.  Negative for dizziness, weakness and headaches.  Psychiatric/Behavioral: Negative.     Per HPI unless specifically indicated above     Objective:    BP (!) 131/91   Pulse (!) 106   Temp 97.9 F (36.6 C) (Oral)   Wt 268 lb 3.2 oz (121.7 kg)   SpO2 93%   BMI 39.61 kg/m   Wt Readings from Last 3 Encounters:  05/24/17 268 lb 3.2 oz (121.7 kg)  05/21/17 275 lb 6.4 oz (124.9 kg)  05/18/17 277 lb 6.4 oz (125.8 kg)    Physical Exam  Constitutional: He is oriented to person, place, and time. He appears well-developed and well-nourished. No distress.  HENT:  Head: Normocephalic and atraumatic.    Pustular tonsil left side.  Decreased tenderness  Eyes: Conjunctivae and lids are normal. Right eye exhibits no discharge. Left eye exhibits no discharge. No scleral icterus.  Neck: Normal range of motion. Neck supple. No JVD present. Carotid bruit is not present.  Cardiovascular: Normal rate, regular rhythm and normal heart sounds.  Pulmonary/Chest: Effort normal and breath sounds normal. No respiratory distress.  Abdominal: Normal appearance. There is no splenomegaly or hepatomegaly.  Musculoskeletal: Normal range of motion.  Neurological: He is alert and oriented to person, place, and time.  Skin: Skin is warm, dry and intact. No rash noted. No pallor.  Psychiatric: He has a normal mood and affect. His behavior is normal. Judgment and thought content normal.    Results for orders placed or performed in visit on 05/21/17  Rapid Strep Screen (Not at [facility])  Result Value Ref Range   Strep Gp A Ag, IA W/Reflex Positive (A) Negative      Assessment & Plan:   Problem List Items Addressed This Visit    None    Visit Diagnoses    Strep pharyngitis    -  Primary   Continued pustular tonsils but exam improved from previous.  Able to eat solid foods.     Pneumonia of right lower lobe due  to group B Streptococcus (HCC)       Lungs clear today.  Pt is clinically improved.  continue Amoxil and finish Zithromax   Other elevated white blood cell (WBC) count       Last CBC with marked elevation.  No f/u done.  Recheck CBC today to check for resolution   Relevant Orders   CBC with Differential/Platelet   Other fatigue       Slow improvement.  Ready to go to work in 2 days.  Note already written       Follow up plan: [facility]

## 2017-05-25 LAB — CBC WITH DIFFERENTIAL/PLATELET
BASOS: 0 %
Basophils Absolute: 0 10*3/uL (ref 0.0–0.2)
EOS (ABSOLUTE): 0.2 10*3/uL (ref 0.0–0.4)
EOS: 3 %
HEMATOCRIT: 42.5 % (ref 37.5–51.0)
Hemoglobin: 15.3 g/dL (ref 13.0–17.7)
IMMATURE GRANULOCYTES: 0 %
Immature Grans (Abs): 0 10*3/uL (ref 0.0–0.1)
LYMPHS ABS: 2.2 10*3/uL (ref 0.7–3.1)
Lymphs: 25 %
MCH: 29.8 pg (ref 26.6–33.0)
MCHC: 36 g/dL — ABNORMAL HIGH (ref 31.5–35.7)
MCV: 83 fL (ref 79–97)
MONOS ABS: 0.6 10*3/uL (ref 0.1–0.9)
Monocytes: 7 %
NEUTROS ABS: 5.8 10*3/uL (ref 1.4–7.0)
NEUTROS PCT: 65 %
Platelets: 501 10*3/uL — ABNORMAL HIGH (ref 150–379)
RBC: 5.13 x10E6/uL (ref 4.14–5.80)
RDW: 13 % (ref 12.3–15.4)
WBC: 8.9 10*3/uL (ref 3.4–10.8)

## 2017-05-26 ENCOUNTER — Inpatient Hospital Stay: Payer: BLUE CROSS/BLUE SHIELD | Admitting: Unknown Physician Specialty

## 2017-05-26 ENCOUNTER — Encounter: Payer: Self-pay | Admitting: Unknown Physician Specialty

## 2018-06-07 ENCOUNTER — Encounter: Payer: Self-pay | Admitting: Family Medicine

## 2018-06-07 ENCOUNTER — Ambulatory Visit: Payer: BLUE CROSS/BLUE SHIELD | Admitting: Family Medicine

## 2018-06-07 ENCOUNTER — Other Ambulatory Visit: Payer: Self-pay

## 2018-06-07 VITALS — BP 141/90 | HR 69 | Temp 98.9°F | Ht 69.0 in | Wt 293.0 lb

## 2018-06-07 DIAGNOSIS — J069 Acute upper respiratory infection, unspecified: Secondary | ICD-10-CM

## 2018-06-07 MED ORDER — AZITHROMYCIN 250 MG PO TABS: ORAL_TABLET | ORAL | 0 refills | Status: DC

## 2018-06-07 MED ORDER — HYDROCOD POLST-CPM POLST ER 10-8 MG/5ML PO SUER: 5.0000 mL | Freq: Every evening | ORAL | 0 refills | Status: DC | PRN

## 2018-06-07 MED ORDER — BENZONATATE 200 MG PO CAPS: 200.0000 mg | ORAL_CAPSULE | Freq: Three times a day (TID) | ORAL | 0 refills | Status: DC | PRN

## 2018-06-07 NOTE — Progress Notes (Signed)
BP (!) 141/90   Pulse 69   Temp 98.9 F (37.2 C) (Oral)   Ht 5\' 9"  (1.753 m)   Wt 293 lb (132.9 kg)   SpO2 96%   BMI 43.27 kg/m    Subjective:    Patient ID: Craig Guerra, male    DOB: July 28, 1985, 33 y.o.   MRN: [id number]  HPI: Craig Guerra is a 33 y.o. male  Chief Complaint  Patient presents with  . Cough    x over a week/ dry and productive cough/ worse when sleeping  . Nasal Congestion  . Chest congestion  . Wheezing   Over a week of congestion, sore throat, cough, wheezing, chest tightness. Taking OTC cold and flu medicines without relief. Now having difficulty sleeping from cough and breathing issues. No hx of asthma or allergic rhinitis. Does have an albuterol inhaler at home from bronchitis last year that he's been using prn which does help. No sick contacts.   Relevant past medical, surgical, family and social history reviewed and updated as indicated. Interim medical history since our last visit reviewed. Allergies and medications reviewed and updated.  Review of Systems  Per HPI unless specifically indicated above     Objective:    BP (!) 141/90   Pulse 69   Temp 98.9 F (37.2 C) (Oral)   Ht 5\' 9"  (1.753 m)   Wt 293 lb (132.9 kg)   SpO2 96%   BMI 43.27 kg/m   Wt Readings from Last 3 Encounters:  06/07/18 293 lb (132.9 kg)  05/24/17 268 lb 3.2 oz (121.7 kg)  05/21/17 275 lb 6.4 oz (124.9 kg)    Physical Exam Vitals signs and nursing note reviewed.  Constitutional:      Appearance: He is well-developed.  HENT:     Head: Atraumatic.     Right Ear: External ear normal.     Left Ear: External ear normal.     Nose: Congestion present.     Mouth/Throat:     Pharynx: Posterior oropharyngeal erythema present. No oropharyngeal exudate.  Eyes:     Conjunctiva/sclera: Conjunctivae normal.     Pupils: Pupils are equal, round, and reactive to light.  Neck:     Musculoskeletal: Normal range of motion and neck supple.  Cardiovascular:     Rate  and Rhythm: Normal rate and regular rhythm.  Pulmonary:     Effort: Pulmonary effort is normal. No respiratory distress.     Breath sounds: No wheezing or rales.  Musculoskeletal: Normal range of motion.  Lymphadenopathy:     Cervical: No cervical adenopathy.  Skin:    General: Skin is warm and dry.  Neurological:     Mental Status: He is alert and oriented to person, place, and time.  Psychiatric:        Behavior: Behavior normal.     Results for orders placed or performed in visit on 05/24/17  CBC with Differential/Platelet  Result Value Ref Range   WBC 8.9 3.4 - 10.8 x10E3/uL   RBC 5.13 4.14 - 5.80 x10E6/uL   Hemoglobin 15.3 13.0 - 17.7 g/dL   Hematocrit 56.242.5 13.037.5 - 51.0 %   MCV 83 79 - 97 fL   MCH 29.8 26.6 - 33.0 pg   MCHC 36.0 (H) 31.5 - 35.7 g/dL   RDW 86.513.0 78.412.3 - 69.615.4 %   Platelets 501 (H) 150 - 379 x10E3/uL   Neutrophils 65 Not Estab. %   Lymphs 25 Not Estab. %  Monocytes 7 Not Estab. %   Eos 3 Not Estab. %   Basos 0 Not Estab. %   Neutrophils Absolute 5.8 1.4 - 7.0 x10E3/uL   Lymphocytes Absolute 2.2 0.7 - 3.1 x10E3/uL   Monocytes Absolute 0.6 0.1 - 0.9 x10E3/uL   EOS (ABSOLUTE) 0.2 0.0 - 0.4 x10E3/uL   Basophils Absolute 0.0 0.0 - 0.2 x10E3/uL   Immature Granulocytes 0 Not Estab. %   Immature Grans (Abs) 0.0 0.0 - 0.1 x10E3/uL      Assessment & Plan:   Problem List Items Addressed This Visit    None    Visit Diagnoses    Upper respiratory tract infection, unspecified type    -  Primary   Tx with zpak, tessalon, tussionex, and mucinex. Supportive care and sedation precautions reviewed. F/u if not improving   Relevant Medications   azithromycin (ZITHROMAX) 250 MG tablet       Follow up plan: Return for CPE.

## 2018-06-09 ENCOUNTER — Other Ambulatory Visit: Payer: Self-pay | Admitting: Nurse Practitioner

## 2018-06-09 ENCOUNTER — Ambulatory Visit: Payer: Self-pay | Admitting: *Deleted

## 2018-06-09 MED ORDER — PREDNISONE 10 MG PO TABS: 30.0000 mg | ORAL_TABLET | Freq: Every day | ORAL | 0 refills | Status: AC

## 2018-06-09 NOTE — Telephone Encounter (Signed)
Pt seen by R. Lane 06/07/2018, URI. Calling today to report cough still severe, productive for greenish-yellow phlegm.  States "Keeping me up at night."  Day 3 of Z-Pack; states is taking all medications as ordered and using inhaler occasionally, mostly in mornings. Denies fever, SOB. Wheezing at times. States chest "Sore from coughing." Does state sore throat and sinus drainage resolved. States "Not really feeling worse, just not any better." States was instructed to CB if not better in few days.  Pt questioning if additional medication is appropriate or if he should take what he was ordered differently.   Please advise if pt. needs to be seen again before completing antibiotic course. [phone]  Reason for Disposition . [1] Continuous (nonstop) coughing interferes with work or school AND [2] no improvement using cough treatment per Care Advice  Answer Assessment - Initial Assessment Questions 1. ONSET: "When did the cough begin?"      06/07/2018 2. SEVERITY: "How bad is the cough today?"      severe 3. RESPIRATORY DISTRESS: "Describe your breathing."      Denies SOB 4. FEVER: "Do you have a fever?" If so, ask: "What is your temperature, how was it measured, and when did it start?"     no 5. SPUTUM: "Describe the color of your sputum" (clear, white, yellow, green)     Yellowish green 6. HEMOPTYSIS: "Are you coughing up any blood?" If so ask: "How much?" (flecks, streaks, tablespoons, etc.)     no 7. CARDIAC HISTORY: "Do you have any history of heart disease?" (e.g., heart attack, congestive heart failure)       8. LUNG HISTORY: "Do you have any history of lung disease?"  (e.g., pulmonary embolus, asthma, emphysema)      9. PE RISK FACTORS: "Do you have a history of blood clots?" (or: recent major surgery, recent prolonged travel, bedridden)      10. OTHER SYMPTOMS: "Do you have any other symptoms?" (e.g., runny nose, wheezing, chest pain)       Hoarse, intermittent wheezing at times,  using inhaler, helps  Protocols used: COUGH - ACUTE PRODUCTIVE-A-AH

## 2018-06-09 NOTE — Progress Notes (Signed)
Patient continues to complain of cough, is on day three of Zpack and inhaler + cough medicine after recent visit to office.  Reports it is not worse, but not better.  Will send in Prednisone to take for 5 days.  Have advised if continued or worsening symptoms then return to office.

## 2018-06-09 NOTE — Telephone Encounter (Signed)
Will send him in some Prednisone 30 MG once a day for 5 days which may help.  It appears he complained of wheezing at his visit and may have come inflammation.  I would continue current medication regimen with the addition of Prednisone.  Use inhaler at night if needed.  He also has Tussionex and Tessalon he should be using.  If worsening cough, even with Prednisone then would advise to return to office.  Please make patient aware.  I will send in Prednisone now.

## 2018-07-11 DIAGNOSIS — R400 Somnolence: Secondary | ICD-10-CM | POA: Diagnosis not present

## 2018-07-11 DIAGNOSIS — Z23 Encounter for immunization: Secondary | ICD-10-CM | POA: Diagnosis not present

## 2018-07-11 DIAGNOSIS — G8929 Other chronic pain: Secondary | ICD-10-CM | POA: Diagnosis not present

## 2018-07-11 DIAGNOSIS — M25562 Pain in left knee: Secondary | ICD-10-CM | POA: Diagnosis not present

## 2018-07-16 DIAGNOSIS — G4733 Obstructive sleep apnea (adult) (pediatric): Secondary | ICD-10-CM | POA: Diagnosis not present

## 2018-08-01 DIAGNOSIS — G4733 Obstructive sleep apnea (adult) (pediatric): Secondary | ICD-10-CM | POA: Diagnosis not present

## 2018-08-08 DIAGNOSIS — G4733 Obstructive sleep apnea (adult) (pediatric): Secondary | ICD-10-CM | POA: Diagnosis not present

## 2018-08-16 DIAGNOSIS — G4733 Obstructive sleep apnea (adult) (pediatric): Secondary | ICD-10-CM | POA: Diagnosis not present

## 2018-09-13 DIAGNOSIS — R202 Paresthesia of skin: Secondary | ICD-10-CM | POA: Diagnosis not present

## 2018-09-15 DIAGNOSIS — M79603 Pain in arm, unspecified: Secondary | ICD-10-CM | POA: Diagnosis not present

## 2018-10-11 IMAGING — CR DG CHEST 2V
2 series · 2 of 2 positions shown · non-contrast
Comparison: None.

CLINICAL DATA: Left-sided chest pain radiating to the left
shoulder. Pain is chronic for 7 years. Pain has worsened this week.

EXAM:
CHEST  2 VIEW

[chest pa]
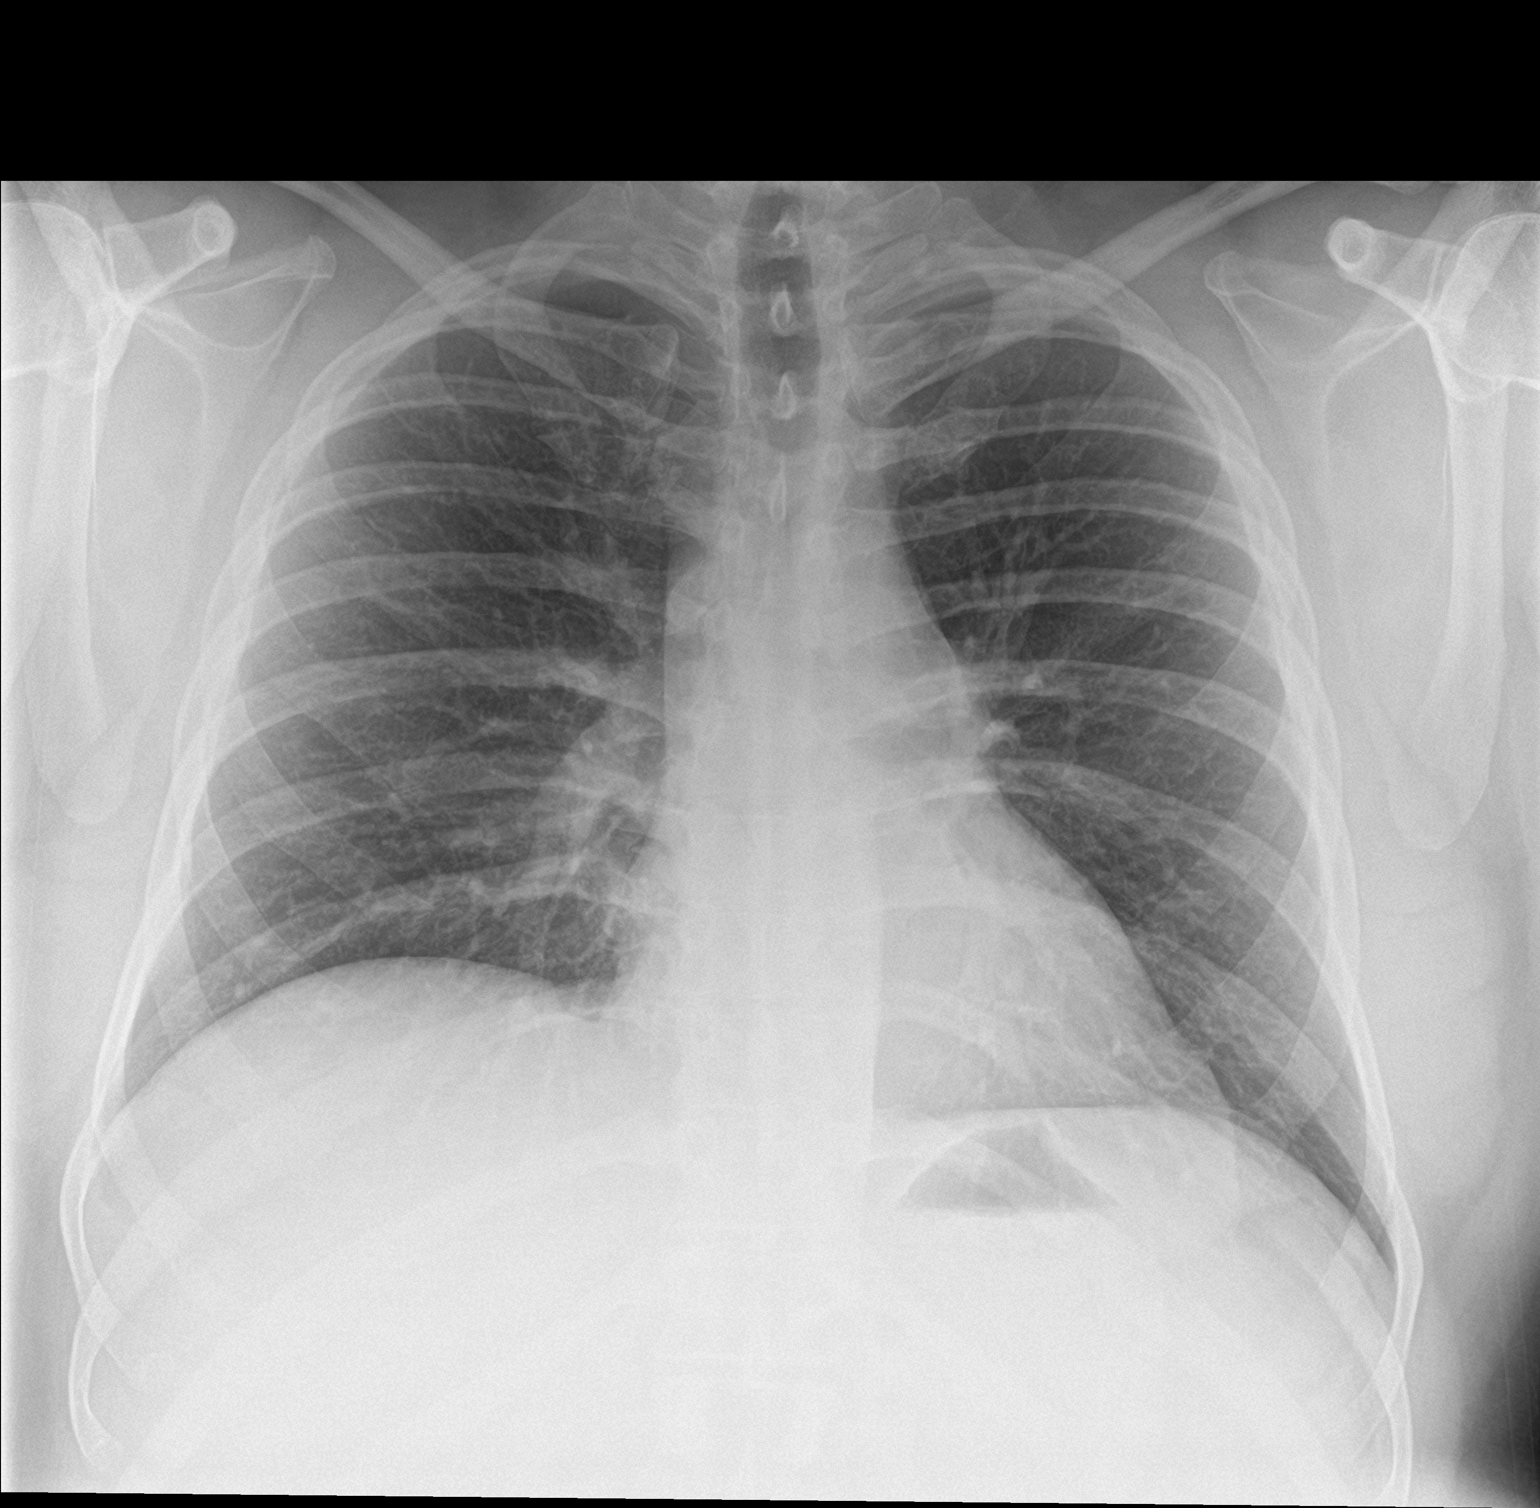

[chest lat]
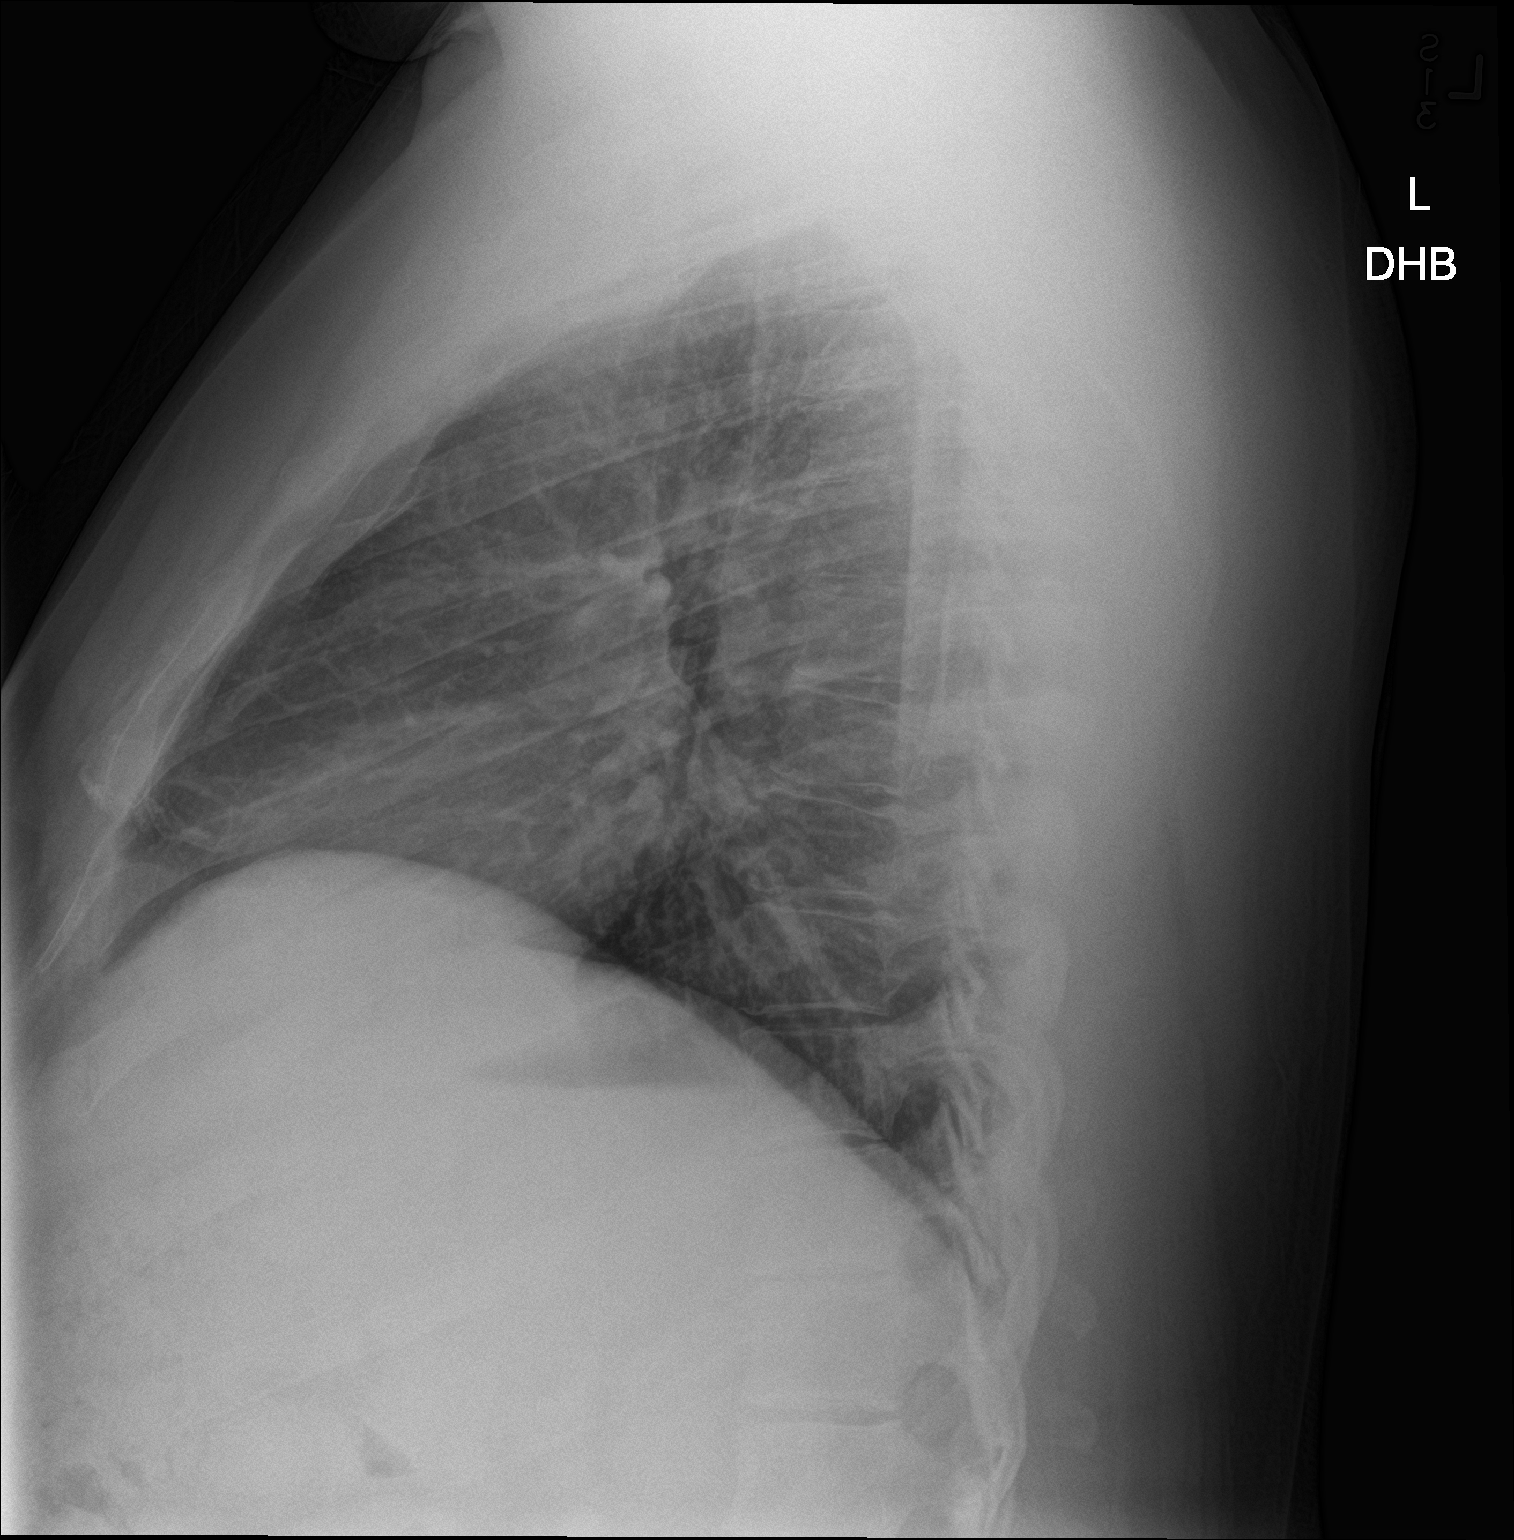

[2 of 2 positions shown; findings below may reference images not displayed]

FINDINGS: The heart size and mediastinal contours are within normal limits.
Both lungs are clear. No pleural effusion or pneumothorax. The
visualized skeletal structures are unremarkable.
IMPRESSION: Normal chest radiographs.

## 2018-10-31 DIAGNOSIS — G4733 Obstructive sleep apnea (adult) (pediatric): Secondary | ICD-10-CM | POA: Diagnosis not present

## 2018-11-29 ENCOUNTER — Ambulatory Visit: Payer: Self-pay | Admitting: *Deleted

## 2018-11-29 ENCOUNTER — Ambulatory Visit: Payer: Self-pay

## 2018-11-29 DIAGNOSIS — Z20828 Contact with and (suspected) exposure to other viral communicable diseases: Secondary | ICD-10-CM | POA: Diagnosis not present

## 2018-11-29 DIAGNOSIS — J029 Acute pharyngitis, unspecified: Secondary | ICD-10-CM | POA: Diagnosis not present

## 2018-11-29 NOTE — Telephone Encounter (Signed)
I returned the call. Wife answered.   He has strep throat.   He had this a year ago and waited too late so it went to his heart and he ended up in the hospital.     When I transferred the call into the office to be scheduled I was informed that this pt had just called in not long ago.   The office is waiting for a response from one of the providers.   I let the pt's wife know this and she said I called earlier  But I forgot to tell them he had been in the hospital with strep that went to his heart a year ago.   "I just didn't want them to wait a day or two before they called [place] back". I let her know as soon as they hear from a provider they would get a call and that it would not be a day or two before they called.   Pt's wife verbalized understanding and apologized for the confusion when she called back.   She just wanted the doctor to know about his strep history.  I let the office know this information when I called them to transfer him.  I sent my notes to the office.    Reason for Disposition . [1] Pus on tonsils (back of throat) AND [2]  fever AND [3] swollen neck lymph nodes ("glands")  Answer Assessment - Initial Assessment Questions 1. ONSET: "When did the throat start hurting?" (Hours or days ago)      Wife on phone     Throat started 1-2 days ago.  No fever 2. SEVERITY: "How bad is the sore throat?" (Scale 1-10; mild, moderate or severe)   - MILD (1-3):  doesn't interfere with eating or normal activities   - MODERATE (4-7): interferes with eating some solids and normal activities   - SEVERE (8-10):  excruciating pain, interferes with most normal activities   - SEVERE DYSPHAGIA: can't swallow liquids, drooling     It's harder for him to swallow his appetite is poor. 3. STREP EXPOSURE: "Has there been any exposure to strep within the past week?" If so, ask: "What type of contact occurred?"      He gets strep throat.   He has white patches in the back of his throat. 4.  VIRAL  SYMPTOMS: "Are there any symptoms of a cold, such as a runny nose, cough, hoarse voice or red eyes?"      Congestion in nose. 5. FEVER: "Do you have a fever?" If so, ask: "What is your temperature, how was it measured, and when did it start?"     No fever  We've been checking it. 6. PUS ON THE TONSILS: "Is there pus on the tonsils in the back of your throat?"     Yes 7. OTHER SYMPTOMS: "Do you have any other symptoms?" (e.g., difficulty breathing, headache, rash)     No headache or shortness of breath 8. PREGNANCY: "Is there any chance you are pregnant?" "When was your last menstrual period?"     N/A  Protocols used: SORE THROAT-A-AH

## 2018-11-29 NOTE — Telephone Encounter (Signed)
Pt notified and stated that he would contact [facility] and [facility].

## 2018-11-29 NOTE — Telephone Encounter (Signed)
Incoming call from Patient who complains of sore throat with onset of yesterday.Rates pain 4-5.  Earlier this am rated 8 to 9.  Denies exposure. Cough seldom.  Denies fever.Reports a white patch on back of left side of throat. Pt.  Is  Requesting a strep test.  In the past Pt. Was hospitalized for strep went to his heart.  Pt. States that he trying to prevent being hospitalized again.    Reason for Disposition . SEVERE (e.g., excruciating) throat pain  Answer Assessment - Initial Assessment Questions 1. ONSET: "When did the throat start hurting?" (Hours or days ago)    yesterday 2. SEVERITY: "How bad is the sore throat?" (Scale 1-10; mild, moderate or severe)   - MILD (1-3):  doesn't interfere with eating or normal activities   - MODERATE (4-7): interferes with eating some solids and normal activities   - SEVERE (8-10):  excruciating pain, interferes with most normal activities   - SEVERE DYSPHAGIA: can't swallow liquids, drooling     This morning 9 19 3. STREP EXPOSURE: "Has there been any exposure to strep within the past week?" If so, ask: "What type of contact occurred?"      denies 4.  VIRAL SYMPTOMS: "Are there any symptoms of a cold, such as a runny nose, cough, hoarse voice or red eyes?"      Cough seldom 5. FEVER: "Do you have a fever?" If so, ask: "What is your temperature, how was it measured, and when did it start?"     denies 6. PUS ON THE TONSILS: "Is there pus on the tonsils in the back of your throat?"    Saw white patch on left 7. OTHER SYMPTOMS: "Do you have any other symptoms?" (e.g., difficulty breathing, headache, rash)     denies 8. PREGNANCY: "Is there any chance you are pregnant?" "When was your last menstrual period?"     *No Answer*  Protocols used: SORE THROAT-A-AH

## 2018-11-29 NOTE — Telephone Encounter (Signed)
Closed due to related encounter.

## 2018-11-29 NOTE — Telephone Encounter (Signed)
Good afternoon.  Due to [place] not seeing acute visits in office with the Covid 19 pandemic I would advise him to immediately go to urgent care or ER for further evaluation if he wishes to have strep testing performed and to have throat assessed further.

## 2018-11-29 NOTE — Telephone Encounter (Signed)
Noted and will follow

## 2018-11-29 NOTE — Telephone Encounter (Signed)
I would recommend urgent care [place] or locally as we are not seeing acute in office due to Covid 19 pandemic.  IF he wants testing he will need to go to urgent care or ER.  That would be best option at this time and then they can determine treatment needed.  Due to symptoms they will most likely also test for Covid on his visit.

## 2019-07-07 DIAGNOSIS — M25562 Pain in left knee: Secondary | ICD-10-CM | POA: Diagnosis not present

## 2019-07-07 DIAGNOSIS — G5693 Unspecified mononeuropathy of bilateral upper limbs: Secondary | ICD-10-CM | POA: Diagnosis not present

## 2019-07-07 DIAGNOSIS — G4733 Obstructive sleep apnea (adult) (pediatric): Secondary | ICD-10-CM | POA: Diagnosis not present

## 2019-07-07 DIAGNOSIS — G8929 Other chronic pain: Secondary | ICD-10-CM | POA: Diagnosis not present

## 2019-08-07 DIAGNOSIS — F419 Anxiety disorder, unspecified: Secondary | ICD-10-CM | POA: Diagnosis not present

## 2019-08-11 IMAGING — CT CT ANGIO CHEST-ABD-PELV FOR DISSECTION W/ AND WO/W CM
2 of 7 series · 15 of 46 positions shown, 17 images · IV contrast (APPLIED)
Comparison: Chest radiograph 05/17/2017

CLINICAL DATA: Left chest pain starting yesterday afternoon come
extending into the neck. Shortness of breath. Elevated troponin.

EXAM:
CT ANGIOGRAPHY CHEST, ABDOMEN AND PELVIS
TECHNIQUE: Multidetector CT imaging through the chest, abdomen and pelvis was
performed using the standard protocol during bolus administration of
intravenous contrast. Multiplanar reconstructed images and MIPs were
obtained and reviewed to evaluate the vascular anatomy.
CONTRAST:  100mL [id number] IOPAMIDOL ([id number]) INJECTION 76%

[Series 7: axial arterial · axial · arterial · 0.82mm/px · z∈[-798,-225]mm · 12 of 219 slices shown, 14 images]
[im 14/219  soft-tissue]
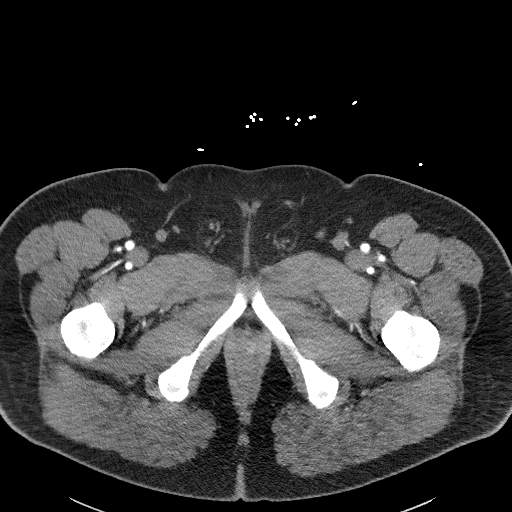
[im 14/219  bone]
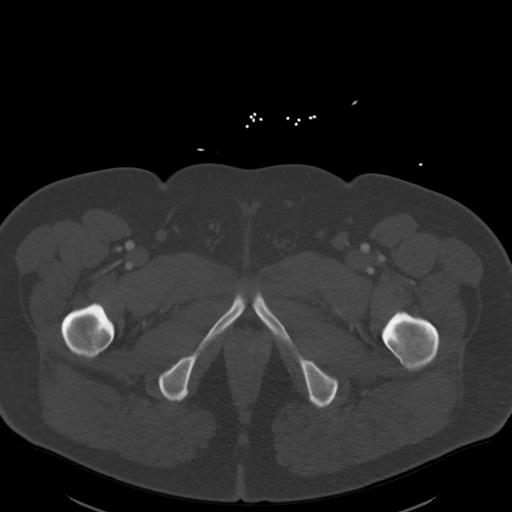
[im 28/219  soft-tissue]
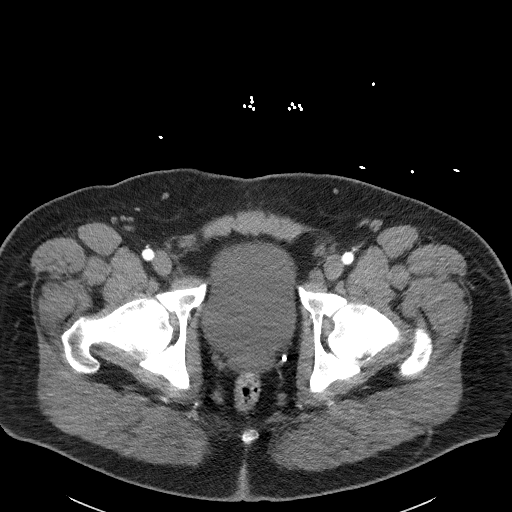
[im 55/219  soft-tissue]
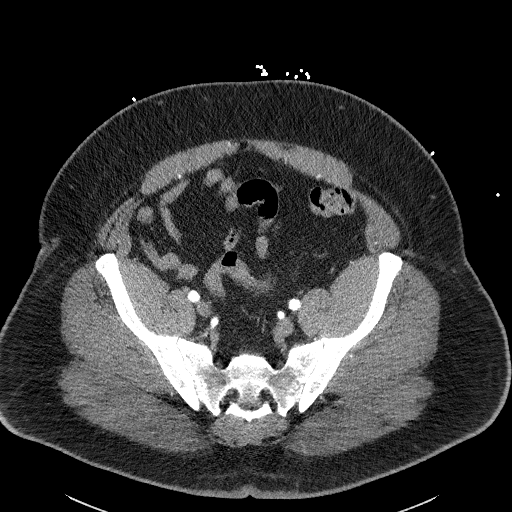
[im 69/219  soft-tissue]
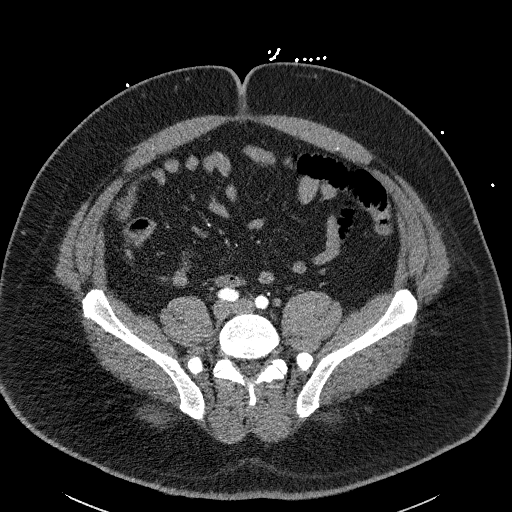
[im 82/219  soft-tissue]
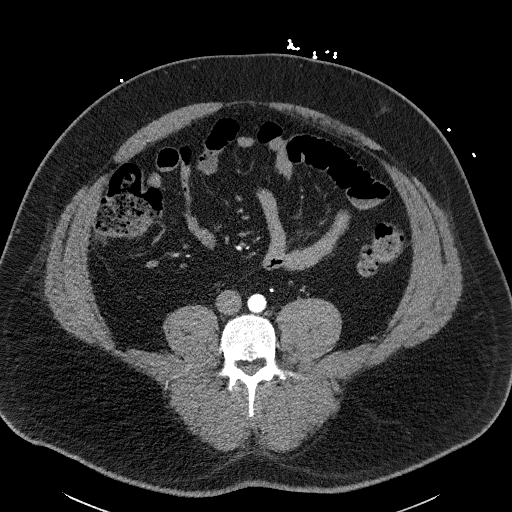
[im 96/219  soft-tissue]
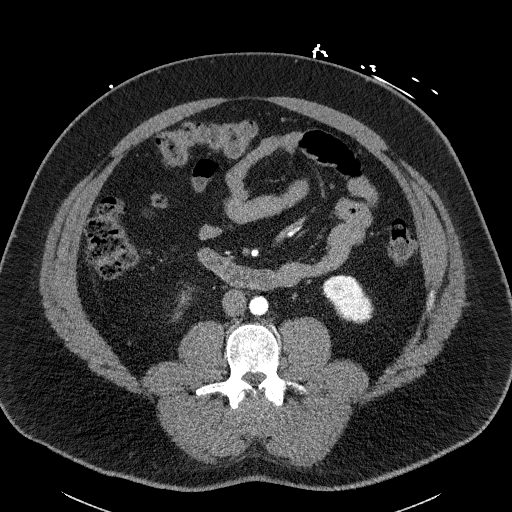
[im 123/219  soft-tissue]
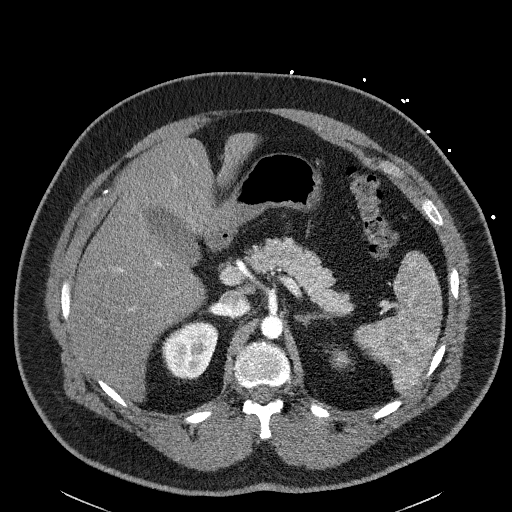
[im 137/219  soft-tissue]
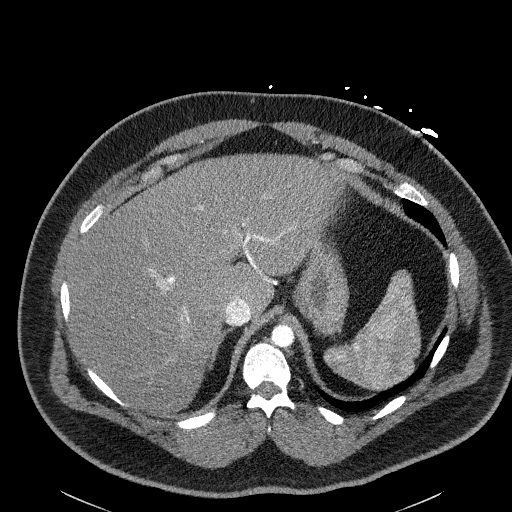
[im 150/219  soft-tissue]
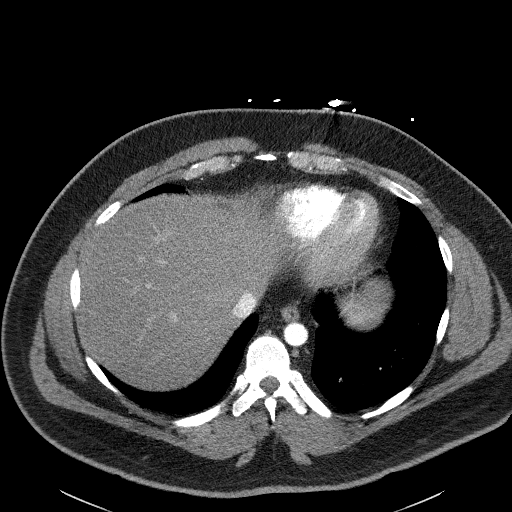
[im 150/219  bone]
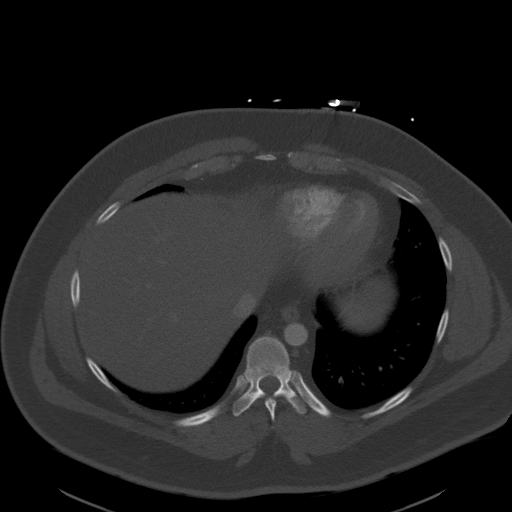
[im 164/219  soft-tissue]
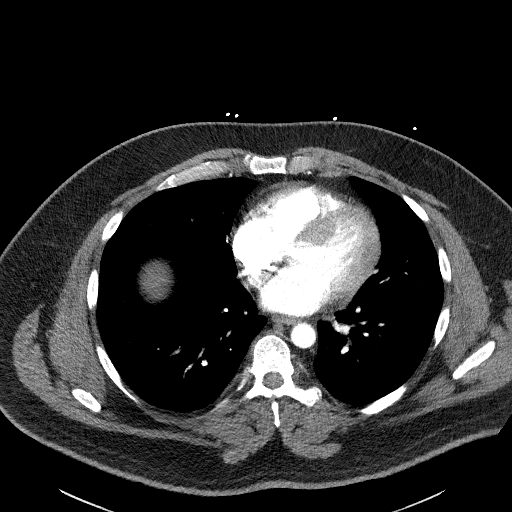
[im 191/219  soft-tissue]
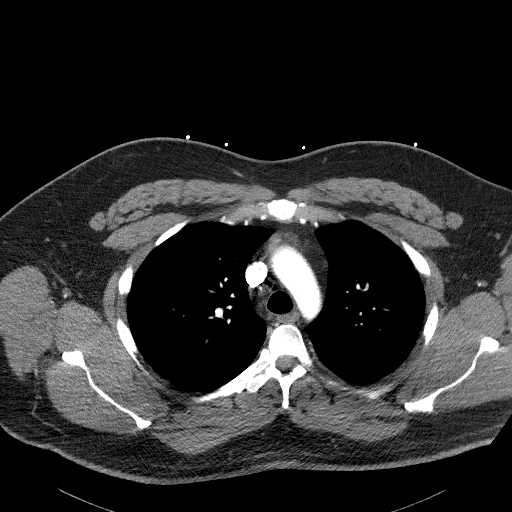
[im 205/219  soft-tissue]
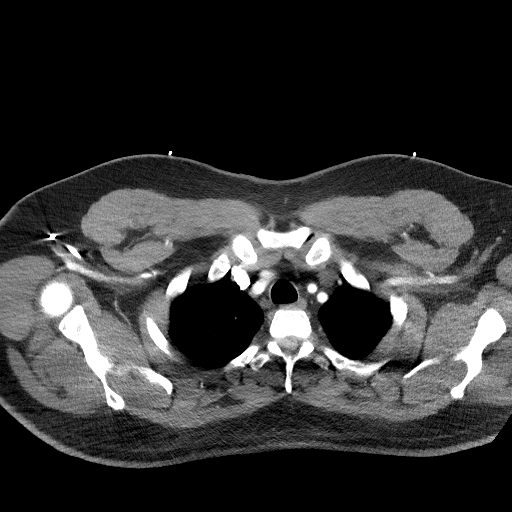

[Series 9: coronals · coronal · 0.90mm/px · 3 of 178 slices shown]
[im 45/178  soft-tissue]
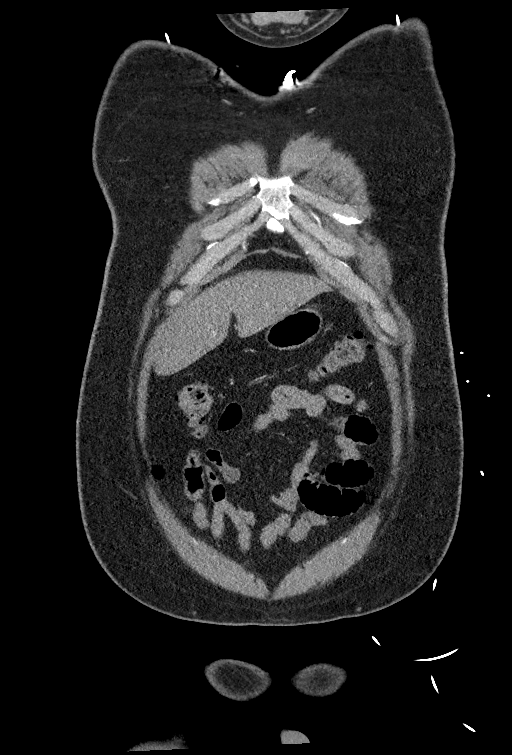
[im 89/178  soft-tissue]
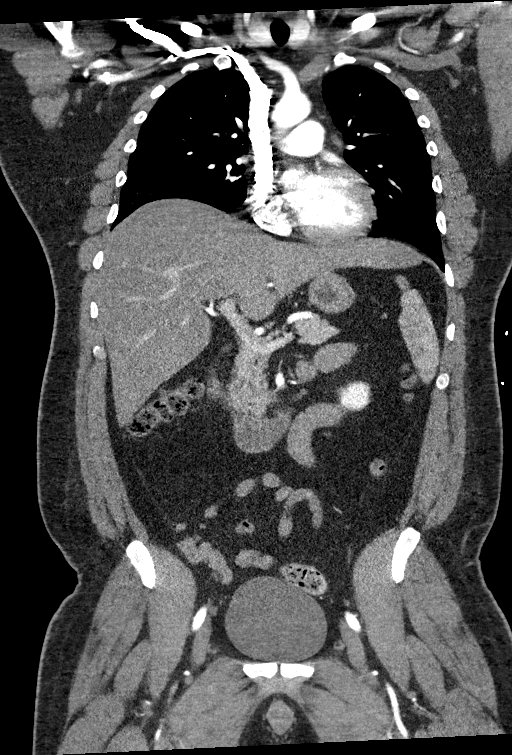
[im 133/178  soft-tissue]
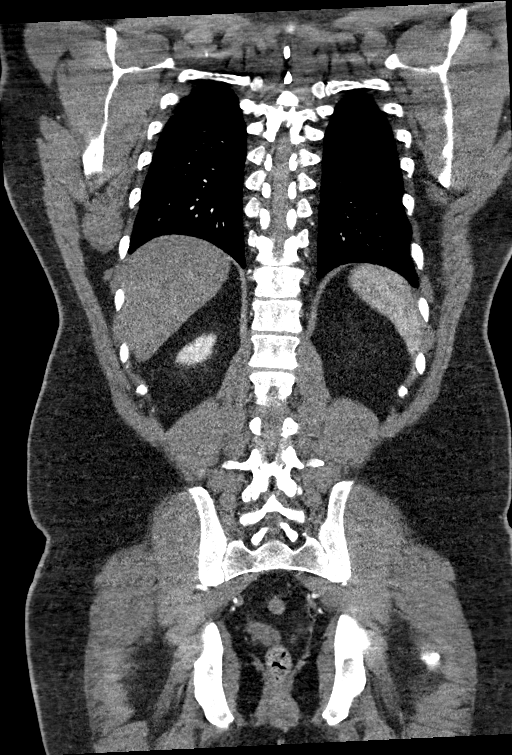

[15 of 46 positions shown; findings below may reference images not displayed]

FINDINGS: CTA CHEST FINDINGS

Cardiovascular: No accentuated density along the aortic or branch
vasculature suggest acute intramural hematoma on the noncontrast
images.

No thoracic aortic dissection is observed. Although today' s exam
was protocol to assess the aorta and systemic arterial vasculature,
there is enough pulmonary arterial contrast to indicate that there
are no large or medium-sized pulmonary emboli. No significant
atherosclerotic vascular calcification in the chest. No cardiomegaly
or pericardial effusion.

Mediastinum/Nodes: No adenopathy noted. Minimal thymic remnant.
Esophagus unremarkable.

Lungs/Pleura: 3 mm left upper lobe nodule on image 69/8, highly
likely to be benign/ incidental in this age group. Suspected
subsegmental atelectasis in the superior segment right lower lobe,
dependent location.

Musculoskeletal: Unremarkable

Review of the MIP images confirms the above findings.

CTA ABDOMEN AND PELVIS FINDINGS

VASCULAR

Aorta: Unremarkable

Celiac: Unremarkable ; conventional hepatic arterial anatomy
branching.

SMA: Unremarkable

Renals: Single bilateral renal arteries appear unremarkable.

IMA: Unremarkable

Inflow: Unremarkable

Veins: Unremarkable

Review of the MIP images confirms the above findings.

NON-VASCULAR

Hepatobiliary: Hepatic steatosis with some fatty sparing around the
gallbladder fossa. Otherwise normal arterial phase appearance the
liver.

Pancreas: Unremarkable

Spleen: Unremarkable

Adrenals/Urinary Tract: Unremarkable

Stomach/Bowel: Unremarkable

Lymphatic: Left external iliac node 0.9 cm in short axis, upper
normal size, image 94/9. No overtly pathologic adenopathy.

Reproductive: Unremarkable

Other: No supplemental non-categorized findings.

Musculoskeletal: Small direct left inguinal hernia contains adipose
tissue.

Review of the MIP images confirms the above findings.
IMPRESSION: 1. No findings of dissection or acute vascular abnormality. A
specific cause for the patient's left chest pain and shortness of
breath is not identified.
2. Hepatic steatosis.
3. Small direct left inguinal hernia contains adipose tissue.

## 2019-08-18 DIAGNOSIS — F439 Reaction to severe stress, unspecified: Secondary | ICD-10-CM | POA: Diagnosis not present

## 2019-08-18 DIAGNOSIS — F419 Anxiety disorder, unspecified: Secondary | ICD-10-CM | POA: Diagnosis not present

## 2019-09-28 DIAGNOSIS — G4733 Obstructive sleep apnea (adult) (pediatric): Secondary | ICD-10-CM | POA: Diagnosis not present

## 2019-10-11 DIAGNOSIS — G4733 Obstructive sleep apnea (adult) (pediatric): Secondary | ICD-10-CM | POA: Diagnosis not present

## 2019-10-12 DIAGNOSIS — G4733 Obstructive sleep apnea (adult) (pediatric): Secondary | ICD-10-CM | POA: Diagnosis not present

## 2019-10-12 DIAGNOSIS — Z8639 Personal history of other endocrine, nutritional and metabolic disease: Secondary | ICD-10-CM | POA: Diagnosis not present

## 2019-10-12 DIAGNOSIS — F439 Reaction to severe stress, unspecified: Secondary | ICD-10-CM | POA: Diagnosis not present

## 2019-11-24 DIAGNOSIS — Z9989 Dependence on other enabling machines and devices: Secondary | ICD-10-CM | POA: Diagnosis not present

## 2019-11-24 DIAGNOSIS — G4733 Obstructive sleep apnea (adult) (pediatric): Secondary | ICD-10-CM | POA: Diagnosis not present

## 2019-11-24 DIAGNOSIS — F439 Reaction to severe stress, unspecified: Secondary | ICD-10-CM | POA: Diagnosis not present

## 2019-11-24 DIAGNOSIS — Z8639 Personal history of other endocrine, nutritional and metabolic disease: Secondary | ICD-10-CM | POA: Diagnosis not present

## 2019-11-29 DIAGNOSIS — G4733 Obstructive sleep apnea (adult) (pediatric): Secondary | ICD-10-CM | POA: Diagnosis not present

## 2020-02-27 DIAGNOSIS — Z20822 Contact with and (suspected) exposure to covid-19: Secondary | ICD-10-CM | POA: Diagnosis not present

## 2020-02-27 DIAGNOSIS — R519 Headache, unspecified: Secondary | ICD-10-CM | POA: Diagnosis not present

## 2020-02-27 DIAGNOSIS — Z03818 Encounter for observation for suspected exposure to other biological agents ruled out: Secondary | ICD-10-CM | POA: Diagnosis not present

## 2020-06-05 DIAGNOSIS — Z20822 Contact with and (suspected) exposure to covid-19: Secondary | ICD-10-CM | POA: Diagnosis not present

## 2020-06-05 DIAGNOSIS — U071 COVID-19: Secondary | ICD-10-CM | POA: Diagnosis not present

## 2020-06-05 DIAGNOSIS — Z03818 Encounter for observation for suspected exposure to other biological agents ruled out: Secondary | ICD-10-CM | POA: Diagnosis not present

## 2020-06-11 DIAGNOSIS — Z20822 Contact with and (suspected) exposure to covid-19: Secondary | ICD-10-CM | POA: Diagnosis not present

## 2020-06-11 DIAGNOSIS — Z03818 Encounter for observation for suspected exposure to other biological agents ruled out: Secondary | ICD-10-CM | POA: Diagnosis not present

## 2020-12-05 ENCOUNTER — Telehealth: Payer: BC Managed Care – PPO | Admitting: Nurse Practitioner

## 2020-12-05 ENCOUNTER — Ambulatory Visit: Payer: Self-pay | Admitting: *Deleted

## 2020-12-05 ENCOUNTER — Other Ambulatory Visit: Payer: Self-pay

## 2020-12-05 ENCOUNTER — Encounter: Payer: Self-pay | Admitting: Nurse Practitioner

## 2020-12-05 VITALS — HR 114 | Temp 100.2°F

## 2020-12-05 DIAGNOSIS — U071 COVID-19: Secondary | ICD-10-CM

## 2020-12-05 MED ORDER — MOLNUPIRAVIR EUA 200MG CAPSULE
4.0000 | ORAL_CAPSULE | Freq: Two times a day (BID) | ORAL | 0 refills | Status: AC
  Filled 2020-12-05: qty 40, 5d supply, fill #0

## 2020-12-05 MED ORDER — HYDROCOD POLST-CPM POLST ER 10-8 MG/5ML PO SUER
5.0000 mL | Freq: Every evening | ORAL | 0 refills | Status: DC | PRN
  Filled 2020-12-05: qty 70, 14d supply, fill #0

## 2020-12-05 MED ORDER — ONDANSETRON 4 MG PO TBDP
4.0000 mg | ORAL_TABLET | Freq: Three times a day (TID) | ORAL | 0 refills | Status: DC | PRN
  Filled 2020-12-05: qty 20, 7d supply, fill #0

## 2020-12-05 MED ORDER — BENZONATATE 200 MG PO CAPS
200.0000 mg | ORAL_CAPSULE | Freq: Three times a day (TID) | ORAL | 0 refills | Status: DC | PRN
  Filled 2020-12-05: qty 40, 14d supply, fill #0

## 2020-12-05 MED ORDER — ALBUTEROL SULFATE HFA 108 (90 BASE) MCG/ACT IN AERS
2.0000 | INHALATION_SPRAY | Freq: Four times a day (QID) | RESPIRATORY_TRACT | 2 refills | Status: DC | PRN
  Filled 2020-12-05: qty 8.5, 30d supply, fill #0

## 2020-12-05 NOTE — Telephone Encounter (Signed)
Added on at 10:40

## 2020-12-05 NOTE — Telephone Encounter (Signed)
Reason for Disposition  SEVERE or constant chest pain or pressure  (Exception: Mild central chest pain, present only when coughing.)  Answer Assessment - Initial Assessment Questions 1. COVID-19 DIAGNOSIS: "Who made your COVID-19 diagnosis?" "Was it confirmed by a positive lab test or self-test?" If not diagnosed by a doctor (or NP/PA), ask "Are there lots of cases (community spread) where you live?" Note: See public health department website, if unsure.     Positive Covid.    Last night I was fine.   Then within 30 minutes I started feeling terrible last night.   Fever, nausea, chills, body aches, fatigue, chest is tight, dry coughing, sore throat.  No runny nose.   Had diarrhea last night but none this morning.    2. COVID-19 EXPOSURE: "Was there any known exposure to COVID before the symptoms began?" CDC Definition of close contact: within 6 feet (2 meters) for a total of 15 minutes or more over a 24-hour period.      *No Answer* 3. ONSET: "When did the COVID-19 symptoms start?"      Last night    4. WORST SYMPTOM: "What is your worst symptom?" (e.g., cough, fever, shortness of breath, muscle aches)     Fever 102 5. COUGH: "Do you have a cough?" If Yes, ask: "How bad is the cough?"       Dry coughing 6. FEVER: "Do you have a fever?" If Yes, ask: "What is your temperature, how was it measured, and when did it start?"     102 this morning  headache  7. RESPIRATORY STATUS: "Describe your breathing?" (e.g., shortness of breath, wheezing, unable to speak)      I'm having tightness in my chest. 8. BETTER-SAME-WORSE: "Are you getting better, staying the same or getting worse compared to yesterday?"  If getting worse, ask, "In what way?"     Worse this morning 9. 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   10. VACCINE: "Have you had the COVID-19 vaccine?" If Yes, ask: "Which  one, how many shots, when did you get it?"       No 11. BOOSTER: "Have you received your COVID-19 booster?" If Yes, ask: "Which one and when did you get it?"       No 12. PREGNANCY: "Is there any chance you are pregnant?" "When was your last menstrual period?"       N/A 13. OTHER SYMPTOMS: "Do you have any other symptoms?"  (e.g., chills, fatigue, headache, loss of smell or taste, muscle pain, sore throat)       See above 14. O2 SATURATION MONITOR:  "Do you use an oxygen saturation monitor (pulse oximeter) at home?" If Yes, ask "What is your reading (oxygen level) today?" "What is your usual oxygen saturation reading?" (e.g., 95%)       No  Protocols used: Coronavirus (COVID-19) Diagnosed or Suspected-A-AH

## 2020-12-05 NOTE — Telephone Encounter (Signed)
noted 

## 2020-12-05 NOTE — Telephone Encounter (Signed)
Can you call him and add him on as a virtual for me today?

## 2020-12-05 NOTE — Telephone Encounter (Addendum)
Pt called in.  He tested positive for Covid this morning on a home test.   Yesterday he noticed he started coughing a little.   By last night he felt terrible with fever, nausea, etc.   See triage notes.    This morning his fever is 102 and he has a terrible headache and chills.   He is having tightness in his chest.   I referred him to the ED however he wanted me to check with Arrie Senate, NP and see about the antiviral.   He will go to the ED if need be but rather not.   I let him know I would check and see and have someone call him back but if he got worse to go on to the ED.   He was agreeable to this plan.  Only high risk factor he mentioned was having an infection in his heart in 2018.  I sent my notes to [facility] high priority for Aura Dials, NP  I called into the office and they sent my notes to [facility] just now and will contact this pt.

## 2020-12-05 NOTE — Assessment & Plan Note (Addendum)
Positive home test 12/04/20 and symptoms that started the same day. He is not vaccinated against covid-19. With this, obesity, and sleep apnea, will treat with molnupiravir. Will also give tessalon, tussionex, zofran, and albuterol prn. Discussed isolation for 5 days and if symptom free after 5 days, can go out as needed with well fitting mask. If still having symptoms after 5 days, will need to isolate for full 10 days. He can continue ibuprofen/tylenol rotating for fever and headache. Encouraged fluids and rest. Note given for work. Discussed ER precautions. Follow-up if symptoms worsen or don't improve. Work note given.

## 2020-12-05 NOTE — Progress Notes (Signed)
Acute Office Visit  Subjective:    Patient ID: Craig Guerra, male    DOB: Mar 05, 1986, 35 y.o.   MRN: [id number]  Chief Complaint  Patient presents with   Covid Positive    Patient states he took an at-home test last night and tested positive. Patient states he became symptomatic last night with coughing, headache, fever (102), diarrhea and shortness of breath. Patient states he has tried Tylenol and Ibuprofen.    Tachycardia   Cough   Headache   Fever   Chills   Muscle Pain    HPI Patient is in today for positive home covid-19 test last night. Symptoms started yesterday, but worsened today. He has not had any covid-19 vaccinations.   UPPER RESPIRATORY TRACT INFECTION  Worst symptom: cough/headache Fever: yes Cough: yes Shortness of breath:  intermittent Wheezing: no Chest pain: no Chest tightness: yes Chest congestion: no Nasal congestion: yes Runny nose: yes Post nasal drip: yes Sneezing: no Sore throat: yes Swollen glands: no Sinus pressure: no Headache: yes Face pain: no Toothache: no Ear pain: no  Ear pressure: no  Eyes red/itching:no Eye drainage/crusting: no  Vomiting: no Rash: no Fatigue: yes Sick contacts: no Strep contacts: no  Context: worse Recurrent sinusitis: no Relief with OTC cold/cough medications:  mild   Treatments attempted:  ibuprofen/tylenol      Past Medical History:  Diagnosis Date   Carpal tunnel syndrome, bilateral    Obesity    Sleep apnea     Past Surgical History:  Procedure Laterality Date   KNEE SURGERY Left    LEFT HEART CATH AND CORONARY ANGIOGRAPHY Right 05/18/2017   Procedure: LEFT HEART CATH AND CORONARY ANGIOGRAPHY;  Surgeon: Laurier Nancy, MD;  Location: [facility] INVASIVE CV LAB;  Service: Cardiovascular;  Laterality: Right;    Family History  Problem Relation Age of Onset   Stroke Mother    Healthy Father     Social History   Socioeconomic History   Marital status: Married    Spouse name: Not on  file   Number of children: Not on file   Years of education: Not on file   Highest education level: Not on file  Occupational History   Not on file  Tobacco Use   Smoking status: Never   Smokeless tobacco: Never  Vaping Use   Vaping Use: Never used  Substance and Sexual Activity   Alcohol use: Yes    Comment: on very rare occasion    Drug use: No   Sexual activity: Not on file  Other Topics Concern   Not on file  Social History Narrative   Not on file   Social Determinants of Health   Financial Resource Strain: Not on file  Food Insecurity: Not on file  Transportation Needs: Not on file  Physical Activity: Not on file  Stress: Not on file  Social Connections: Not on file  Intimate Partner Violence: Not on file    Outpatient Medications Prior to Visit  Medication Sig Dispense Refill   albuterol (PROVENTIL HFA;VENTOLIN HFA) 108 (90 Base) MCG/ACT inhaler Inhale 2 puffs into the lungs every 6 (six) hours as needed for wheezing or shortness of breath. 1 Inhaler 2   azithromycin (ZITHROMAX) 250 MG tablet Take 2 tabs day one, then 1 tab daily until complete (Patient not taking: Reported on 12/05/2020) 6 tablet 0   ibuprofen (ADVIL,MOTRIN) 600 MG tablet Take 1 tablet (600 mg total) by mouth every 8 (eight) hours as needed. (Patient not  taking: Reported on 12/05/2020) 30 tablet 0   benzonatate (TESSALON) 200 MG capsule Take 1 capsule (200 mg total) by mouth 3 (three) times daily as needed. (Patient not taking: Reported on 12/05/2020) 40 capsule 0   chlorpheniramine-HYDROcodone (TUSSIONEX PENNKINETIC ER) 10-8 MG/5ML SUER Take 5 mLs by mouth at bedtime as needed. (Patient not taking: Reported on 12/05/2020) 50 mL 0   No facility-administered medications prior to visit.    Allergies  Allergen Reactions   Augmentin [Amoxicillin-Pot Clavulanate] Nausea And Vomiting    Review of Systems  Constitutional:  Positive for chills, fatigue and fever.  HENT:  Positive for congestion, rhinorrhea  and sore throat. Negative for ear pain, postnasal drip and sneezing.   Eyes: Negative.   Respiratory:  Positive for cough, chest tightness and shortness of breath (intermittent).   Cardiovascular: Negative.   Gastrointestinal:  Positive for diarrhea and nausea. Negative for abdominal pain and vomiting.  Genitourinary: Negative.   Musculoskeletal:  Positive for myalgias.  Skin: Negative.   Neurological:  Positive for headaches. Negative for dizziness.      Objective:    Physical Exam Vitals and nursing note reviewed.  Constitutional:      General: He is not in acute distress.    Appearance: Normal appearance.  HENT:     Head: Normocephalic.  Eyes:     Conjunctiva/sclera: Conjunctivae normal.  Pulmonary:     Effort: Pulmonary effort is normal.     Comments: Able to talk in complete sentences Neurological:     Mental Status: He is alert and oriented to person, place, and time.  Psychiatric:        Mood and Affect: Mood normal.        Behavior: Behavior normal.        Thought Content: Thought content normal.        Judgment: Judgment normal.    Pulse (!) 114   Temp 100.2 F (37.9 C) (Oral)  Wt Readings from Last 3 Encounters:  06/07/18 293 lb (132.9 kg)  05/24/17 268 lb 3.2 oz (121.7 kg)  05/21/17 275 lb 6.4 oz (124.9 kg)    Health Maintenance Due  Topic Date Due   COVID-19 Vaccine (1) Never done   HIV Screening  Never done   Hepatitis C Screening  Never done   TETANUS/TDAP  Never done    There are no preventive care reminders to display for this patient.   Lab Results  Component Value Date   TSH 1.247 05/17/2017   Lab Results  Component Value Date   WBC 8.9 05/24/2017   HGB 15.3 05/24/2017   HCT 42.5 05/24/2017   MCV 83 05/24/2017   PLT 501 (H) 05/24/2017   Lab Results  Component Value Date   NA 137 05/17/2017   K 4.1 05/17/2017   CO2 26 05/17/2017   GLUCOSE 112 (H) 05/17/2017   BUN 13 05/17/2017   CREATININE 0.84 05/17/2017   CALCIUM 9.3  05/17/2017   ANIONGAP 8 05/17/2017   Lab Results  Component Value Date   CHOL 194 05/18/2017   Lab Results  Component Value Date   HDL 36 (L) 05/18/2017   Lab Results  Component Value Date   LDLCALC 122 (H) 05/18/2017   Lab Results  Component Value Date   TRIG 178 (H) 05/18/2017   Lab Results  Component Value Date   CHOLHDL 5.4 05/18/2017   Lab Results  Component Value Date   HGBA1C 5.5 05/17/2017       Assessment &  Plan:   Problem List Items Addressed This Visit       Other   COVID-19 - Primary    Positive home test 12/04/20 and symptoms that started the same day. He is not vaccinated against covid-19. With this, obesity, and sleep apnea, will treat with molnupiravir. Will also give tessalon, tussionex, zofran, and albuterol prn. Discussed isolation for 5 days and if symptom free after 5 days, can go out as needed with well fitting mask. If still having symptoms after 5 days, will need to isolate for full 10 days. He can continue ibuprofen/tylenol rotating for fever and headache. Encouraged fluids and rest. Note given for work. Discussed ER precautions. Follow-up if symptoms worsen or don't improve. Work note given.       Relevant Medications   molnupiravir EUA 200 mg CAPS     Meds ordered this encounter  Medications   molnupiravir EUA 200 mg CAPS    Sig: Take 4 capsules (800 mg total) by mouth 2 (two) times daily for 5 days.    Dispense:  40 capsule    Refill:  0   benzonatate (TESSALON) 200 MG capsule    Sig: Take 1 capsule (200 mg total) by mouth 3 (three) times daily as needed.    Dispense:  40 capsule    Refill:  0   chlorpheniramine-HYDROcodone (TUSSIONEX PENNKINETIC ER) 10-8 MG/5ML SUER    Sig: Take 5 mLs by mouth at bedtime as needed.    Dispense:  70 mL    Refill:  0   ondansetron (ZOFRAN ODT) 4 MG disintegrating tablet    Sig: Take 1 tablet (4 mg total) by mouth every 8 (eight) hours as needed for nausea or vomiting.    Dispense:  20 tablet     Refill:  0   albuterol (VENTOLIN HFA) 108 (90 Base) MCG/ACT inhaler    Sig: Inhale 2 puffs into the lungs every 6 (six) hours as needed for wheezing or shortness of breath.    Dispense:  1 each    Refill:  2     Gerre Scull, NP

## 2020-12-31 DIAGNOSIS — G4733 Obstructive sleep apnea (adult) (pediatric): Secondary | ICD-10-CM | POA: Diagnosis not present

## 2021-04-14 ENCOUNTER — Ambulatory Visit: Payer: BC Managed Care – PPO | Admitting: Nurse Practitioner

## 2021-04-14 ENCOUNTER — Other Ambulatory Visit: Payer: Self-pay

## 2021-04-14 ENCOUNTER — Encounter: Payer: Self-pay | Admitting: Nurse Practitioner

## 2021-04-14 ENCOUNTER — Telehealth: Payer: Self-pay | Admitting: Nurse Practitioner

## 2021-04-14 VITALS — BP 145/89 | HR 93 | Temp 98.6°F | Wt 322.4 lb

## 2021-04-14 DIAGNOSIS — R0981 Nasal congestion: Secondary | ICD-10-CM | POA: Diagnosis not present

## 2021-04-14 DIAGNOSIS — H6690 Otitis media, unspecified, unspecified ear: Secondary | ICD-10-CM | POA: Diagnosis not present

## 2021-04-14 LAB — VERITOR FLU A/B WAIVED
Influenza A: NEGATIVE
Influenza B: NEGATIVE

## 2021-04-14 MED ORDER — DOXYCYCLINE HYCLATE 100 MG PO TABS: 100.0000 mg | ORAL_TABLET | Freq: Two times a day (BID) | ORAL | 0 refills | Status: DC

## 2021-04-14 NOTE — Progress Notes (Signed)
Acute Office Visit  Subjective:    Patient ID: Craig Guerra, male    DOB: 1985/07/10, 35 y.o.   MRN: [id number]  Chief Complaint  Patient presents with   Sore Throat   Ear Pain   Headache   Head Congestion    Patient states he became symptomatic Thursday flew to [place] and states the pressure of flying didn't help at all. Patient states he thought he was feeling better Saturday and then yesterday he symptoms returned with ear pain and states he has constant ringing in his ears. Patient states he has tried over the counter Tylenol Cold and Sinus.     HPI Patient is in today for sore throat, congestion, and ear pain since Thursday.   UPPER RESPIRATORY TRACT INFECTION  Worst symptom: right ear pain Fever: no Cough:  mild Shortness of breath: no Wheezing: no Chest pain: no Chest tightness: no Chest congestion: no Nasal congestion: yes Runny nose: yes Post nasal drip: yes Sneezing: yes Sore throat: yes Swollen glands: no Sinus pressure: yes Headache: yes Face pain: no Toothache: no Ear pain: no  Ear pressure: yes "right Eyes red/itching:no Eye drainage/crusting: no  Vomiting: no Rash: no Fatigue: yes Sick contacts: yes - sinusitis  Strep contacts: no  Context: worse Recurrent sinusitis: no Relief with OTC cold/cough medications: yes  Treatments attempted: Tylenol cold and sinus    Past Medical History:  Diagnosis Date   Carpal tunnel syndrome, bilateral    Obesity    Sleep apnea     Past Surgical History:  Procedure Laterality Date   KNEE SURGERY Left    LEFT HEART CATH AND CORONARY ANGIOGRAPHY Right 05/18/2017   Procedure: LEFT HEART CATH AND CORONARY ANGIOGRAPHY;  Surgeon: Laurier Nancy, MD;  Location: [facility] INVASIVE CV LAB;  Service: Cardiovascular;  Laterality: Right;    Family History  Problem Relation Age of Onset   Stroke Mother    Healthy Father     Social History   Socioeconomic History   Marital status: Married    Spouse name:  Not on file   Number of children: Not on file   Years of education: Not on file   Highest education level: Not on file  Occupational History   Not on file  Tobacco Use   Smoking status: Never   Smokeless tobacco: Never  Vaping Use   Vaping Use: Never used  Substance and Sexual Activity   Alcohol use: Yes    Comment: on very rare occasion    Drug use: No   Sexual activity: Not on file  Other Topics Concern   Not on file  Social History Narrative   Not on file   Social Determinants of Health   Financial Resource Strain: Not on file  Food Insecurity: Not on file  Transportation Needs: Not on file  Physical Activity: Not on file  Stress: Not on file  Social Connections: Not on file  Intimate Partner Violence: Not on file    Outpatient Medications Prior to Visit  Medication Sig Dispense Refill   albuterol (VENTOLIN HFA) 108 (90 Base) MCG/ACT inhaler Inhale 2 puffs into the lungs every 6 (six) hours as needed for wheezing or shortness of breath. 8.5 g 2   azithromycin (ZITHROMAX) 250 MG tablet Take 2 tabs day one, then 1 tab daily until complete (Patient not taking: No sig reported) 6 tablet 0   benzonatate (TESSALON) 200 MG capsule Take 1 capsule (200 mg total) by mouth 3 (three) times daily  as needed. (Patient not taking: Reported on 04/14/2021) 40 capsule 0   chlorpheniramine-HYDROcodone (TUSSIONEX PENNKINETIC ER) 10-8 MG/5ML SUER Take 5 mLs by mouth at bedtime as needed. (Patient not taking: Reported on 04/14/2021) 70 mL 0   ibuprofen (ADVIL,MOTRIN) 600 MG tablet Take 1 tablet (600 mg total) by mouth every 8 (eight) hours as needed. (Patient not taking: No sig reported) 30 tablet 0   ondansetron (ZOFRAN ODT) 4 MG disintegrating tablet Take 1 tablet (4 mg total) by mouth every 8 (eight) hours as needed for nausea or vomiting. (Patient not taking: Reported on 04/14/2021) 20 tablet 0   No facility-administered medications prior to visit.    Allergies  Allergen Reactions    Augmentin [Amoxicillin-Pot Clavulanate] Nausea And Vomiting    Review of Systems  Constitutional:  Positive for fatigue. Negative for fever.  HENT:  Positive for congestion, ear pain, postnasal drip, rhinorrhea, sinus pressure, sneezing, sore throat and tinnitus (right ear).   Eyes: Negative.   Respiratory:  Positive for cough. Negative for shortness of breath.   Cardiovascular: Negative.   Gastrointestinal: Negative.   Genitourinary: Negative.   Musculoskeletal: Negative.   Skin: Negative.   Neurological: Negative.       Objective:    Physical Exam Vitals and nursing note reviewed.  Constitutional:      Appearance: Normal appearance.  HENT:     Head: Normocephalic.     Right Ear: Ear canal normal. Tympanic membrane is erythematous.     Left Ear: Tympanic membrane and ear canal normal.     Mouth/Throat:     Mouth: Mucous membranes are moist.     Pharynx: Posterior oropharyngeal erythema present.  Eyes:     Conjunctiva/sclera: Conjunctivae normal.  Cardiovascular:     Rate and Rhythm: Normal rate and regular rhythm.     Pulses: Normal pulses.     Heart sounds: Normal heart sounds.  Pulmonary:     Effort: Pulmonary effort is normal.     Breath sounds: Normal breath sounds.  Abdominal:     Palpations: Abdomen is soft.     Tenderness: There is no abdominal tenderness.  Musculoskeletal:     Cervical back: Normal range of motion. No tenderness.  Lymphadenopathy:     Cervical: Cervical adenopathy present.  Skin:    General: Skin is warm.  Neurological:     General: No focal deficit present.     Mental Status: He is alert and oriented to person, place, and time.  Psychiatric:        Mood and Affect: Mood normal.        Behavior: Behavior normal.        Thought Content: Thought content normal.        Judgment: Judgment normal.    BP (!) 145/89   Pulse 93   Temp 98.6 F (37 C) (Oral)   Wt (!) 322 lb 6.4 oz (146.2 kg)   SpO2 96%   BMI 47.61 kg/m  Wt Readings from  Last 3 Encounters:  04/14/21 (!) 322 lb 6.4 oz (146.2 kg)  06/07/18 293 lb (132.9 kg)  05/24/17 268 lb 3.2 oz (121.7 kg)    Health Maintenance Due  Topic Date Due   HIV Screening  Never done   Hepatitis C Screening  Never done   TETANUS/TDAP  Never done    There are no preventive care reminders to display for this patient.   Lab Results  Component Value Date   TSH 1.247 05/17/2017   Lab  Results  Component Value Date   WBC 8.9 05/24/2017   HGB 15.3 05/24/2017   HCT 42.5 05/24/2017   MCV 83 05/24/2017   PLT 501 (H) 05/24/2017   Lab Results  Component Value Date   NA 137 05/17/2017   K 4.1 05/17/2017   CO2 26 05/17/2017   GLUCOSE 112 (H) 05/17/2017   BUN 13 05/17/2017   CREATININE 0.84 05/17/2017   CALCIUM 9.3 05/17/2017   ANIONGAP 8 05/17/2017   Lab Results  Component Value Date   CHOL 194 05/18/2017   Lab Results  Component Value Date   HDL 36 (L) 05/18/2017   Lab Results  Component Value Date   LDLCALC 122 (H) 05/18/2017   Lab Results  Component Value Date   TRIG 178 (H) 05/18/2017   Lab Results  Component Value Date   CHOLHDL 5.4 05/18/2017   Lab Results  Component Value Date   HGBA1C 5.5 05/17/2017       Assessment & Plan:   Problem List Items Addressed This Visit   None Visit Diagnoses     Head congestion    -  Primary   Flu negative. Start zyrtec daily. Encourage rest and fluids. Work note given.    Relevant Orders   Influenza A & B (STAT) (Completed)   Acute otitis media, unspecified otitis media type       Will start doxycyline. Can take ibuprofen or tylenol for pain. Follow up if symptoms don't improve.    Relevant Medications   doxycycline (VIBRA-TABS) 100 MG tablet        Meds ordered this encounter  Medications   doxycycline (VIBRA-TABS) 100 MG tablet    Sig: Take 1 tablet (100 mg total) by mouth 2 (two) times daily.    Dispense:  14 tablet    Refill:  0     Gerre Scull, NP

## 2021-04-14 NOTE — Telephone Encounter (Signed)
Called pt to advise note is ready for pickup.

## 2021-04-14 NOTE — Patient Instructions (Addendum)
Start zyrtec daily Start doxycyline twice a day for 7 days Drink plenty of fluids

## 2021-04-14 NOTE — Telephone Encounter (Signed)
Pt is calling after seen in office pt is needing a revised letter stating he has no restrictions.  Pt is unable to return back to work with out the work note. Pt is able to come pick it up today or tomorrow. Please advise when letter is ready. [phone]

## 2021-07-26 DIAGNOSIS — R070 Pain in throat: Secondary | ICD-10-CM | POA: Diagnosis not present

## 2021-07-26 DIAGNOSIS — R519 Headache, unspecified: Secondary | ICD-10-CM | POA: Diagnosis not present

## 2021-07-26 DIAGNOSIS — Z20822 Contact with and (suspected) exposure to covid-19: Secondary | ICD-10-CM | POA: Diagnosis not present

## 2021-07-26 DIAGNOSIS — J020 Streptococcal pharyngitis: Secondary | ICD-10-CM | POA: Diagnosis not present

## 2021-09-08 DIAGNOSIS — G8929 Other chronic pain: Secondary | ICD-10-CM | POA: Diagnosis not present

## 2021-09-08 DIAGNOSIS — G5603 Carpal tunnel syndrome, bilateral upper limbs: Secondary | ICD-10-CM | POA: Diagnosis not present

## 2021-09-08 DIAGNOSIS — G4733 Obstructive sleep apnea (adult) (pediatric): Secondary | ICD-10-CM | POA: Diagnosis not present

## 2021-09-08 DIAGNOSIS — M25562 Pain in left knee: Secondary | ICD-10-CM | POA: Diagnosis not present

## 2021-10-08 DIAGNOSIS — G5603 Carpal tunnel syndrome, bilateral upper limbs: Secondary | ICD-10-CM | POA: Diagnosis not present

## 2021-12-15 ENCOUNTER — Ambulatory Visit: Payer: BC Managed Care – PPO | Admitting: Nurse Practitioner

## 2021-12-15 ENCOUNTER — Encounter: Payer: Self-pay | Admitting: Nurse Practitioner

## 2021-12-15 VITALS — BP 124/72 | HR 79 | Temp 98.2°F | Ht 69.0 in | Wt 321.0 lb

## 2021-12-15 DIAGNOSIS — H66001 Acute suppurative otitis media without spontaneous rupture of ear drum, right ear: Secondary | ICD-10-CM

## 2021-12-15 DIAGNOSIS — Z01818 Encounter for other preprocedural examination: Secondary | ICD-10-CM | POA: Diagnosis not present

## 2021-12-15 DIAGNOSIS — H6691 Otitis media, unspecified, right ear: Secondary | ICD-10-CM | POA: Insufficient documentation

## 2021-12-15 DIAGNOSIS — G5602 Carpal tunnel syndrome, left upper limb: Secondary | ICD-10-CM | POA: Diagnosis not present

## 2021-12-15 DIAGNOSIS — R7303 Prediabetes: Secondary | ICD-10-CM | POA: Insufficient documentation

## 2021-12-15 DIAGNOSIS — H6090 Unspecified otitis externa, unspecified ear: Secondary | ICD-10-CM | POA: Insufficient documentation

## 2021-12-15 MED ORDER — CEFPODOXIME PROXETIL 200 MG PO TABS: 200.0000 mg | ORAL_TABLET | Freq: Two times a day (BID) | ORAL | 0 refills | Status: DC

## 2021-12-15 NOTE — Assessment & Plan Note (Addendum)
Acute for 4 days. Order for Cefpodoxime 200 MG tablet, take 2 tabs PO for 7 days. Follow up in 1 week to reassess ear, sooner if any worsening symptoms.  TM intact at this time.

## 2021-12-15 NOTE — Progress Notes (Signed)
BP 124/72 (BP Location: Left Arm, Cuff Size: Normal)   Pulse 79   Temp 98.2 F (36.8 C) (Oral)   Ht 5\' 9"  (1.753 m)   Wt (!) 321 lb (145.6 kg)   SpO2 97%   BMI 47.40 kg/m    Subjective:    Patient ID: Craig Guerra, male    DOB: 17-Dec-1985, 36 y.o.   MRN: [id number]  HPI: Craig Guerra is a 36 y.o. male  Chief Complaint  Patient presents with   Ear Pain    Patient says Friday, he felt as his ear was clogged. Patient says he then tried to clean it and says he saw some blood. Patient say now it just feels as if it was clogged. Patient denies trying to clean his ear. Patient says it is making his tinnitus worse. Patient says he feels as if it may be sweat in it.    NOTE WRITTEN BY FNP STUDENT.  ASSESSMENT AND PLAN OF CARE REVIEWED WITH STUDENT, AGREE WITH ABOVE FINDINGS AND PLAN.   EAR PAIN Pain in the R ear that started Friday and gets worse with bending over.  Wife wanted him to be seen today.  Receives all primary care at Wednesday. Duration: days Involved ear(s): right Severity:  2/10  Quality:  Clogged Fever: no Otorrhea: no Upper respiratory infection symptoms: no Pruritus: no Hearing loss: no Water immersion yes Using Q-tips: yes Recurrent otitis media: no Status: stable Treatments attempted: none   Relevant past medical, surgical, family and social history reviewed and updated as indicated. Interim medical history since our last visit reviewed. Allergies and medications reviewed and updated.  Review of Systems  Constitutional:  Negative for chills and fever.  HENT:  Positive for ear pain and tinnitus. Negative for congestion, ear discharge, hearing loss, postnasal drip, rhinorrhea, sinus pressure and sinus pain.   Eyes:  Negative for redness and itching.  Respiratory:  Negative for cough, chest tightness and shortness of breath.   Cardiovascular:  Negative for chest pain, palpitations and leg swelling.  Neurological: Negative.     Per HPI unless specifically  indicated above     Objective:    BP 124/72 (BP Location: Left Arm, Cuff Size: Normal)   Pulse 79   Temp 98.2 F (36.8 C) (Oral)   Ht 5\' 9"  (1.753 m)   Wt (!) 321 lb (145.6 kg)   SpO2 97%   BMI 47.40 kg/m   Wt Readings from Last 3 Encounters:  12/15/21 (!) 321 lb (145.6 kg)  04/14/21 (!) 322 lb 6.4 oz (146.2 kg)  06/07/18 293 lb (132.9 kg)    Physical Exam Vitals and nursing note reviewed.  Constitutional:      Appearance: Normal appearance.  HENT:     Head: Normocephalic and atraumatic.     Right Ear: Decreased hearing noted. Drainage, swelling and tenderness present.     Left Ear: Hearing and tympanic membrane normal.     Ears:     Comments: Erythema in BL ear canals. R TM with white, cloudy appearance. Unable to view bony landmarks.     Nose: Nose normal.     Mouth/Throat:     Lips: Pink.     Mouth: Mucous membranes are moist.     Pharynx: Oropharynx is clear. Uvula midline. No posterior oropharyngeal erythema.  Eyes:     General: Lids are normal.        Right eye: No discharge.        Left eye:  No discharge.     Pupils: Pupils are equal, round, and reactive to light.  Neck:     Thyroid: No thyromegaly or thyroid tenderness.  Cardiovascular:     Rate and Rhythm: Normal rate.     Heart sounds: Normal heart sounds.  Pulmonary:     Effort: Pulmonary effort is normal.     Breath sounds: Normal breath sounds.  Lymphadenopathy:     Head:     Right side of head: No submental, submandibular, tonsillar, preauricular or posterior auricular adenopathy.     Left side of head: No submental, submandibular, tonsillar, preauricular or posterior auricular adenopathy.     Cervical: No cervical adenopathy.  Neurological:     Mental Status: He is alert.  Psychiatric:        Behavior: Behavior is cooperative.    Results for orders placed or performed in visit on 04/14/21  Influenza A & B (STAT)  Result Value Ref Range   Influenza A Negative Negative   Influenza B Negative  Negative      Assessment & Plan:   Problem List Items Addressed This Visit       Nervous and Auditory   Otitis media of right ear - Primary    Acute for 4 days. Order for Cefpodoxime 200 MG tablet, take 2 tabs PO for 7 days. Follow up in 1 week to reassess ear, sooner if any worsening symptoms.  TM intact at this time.      Relevant Medications   cefpodoxime (VANTIN) 200 MG tablet     Follow up plan: Return in about 1 week (around 12/22/2021) for Otitis media.

## 2021-12-15 NOTE — Progress Notes (Deleted)
   BP 124/72 (BP Location: Left Arm, Cuff Size: Normal)   Pulse 79   Temp 98.2 F (36.8 C) (Oral)   Ht 5\' 9"  (1.753 m)   Wt (!) 321 lb (145.6 kg)   SpO2 97%   BMI 47.40 kg/m    Subjective:    Patient ID: Craig Guerra, male    DOB: [birth year], 36 y.o.   MRN: [id number]  HPI: Craig Guerra is a 36 y.o. male  Chief Complaint  Patient presents with   Ear Pain    Patient says Friday, he felt as his ear was clogged. Patient says he then tried to clean it and says he saw some blood. Patient say now it just feels as if it was clogged. Patient denies trying to clean his ear. Patient says it is making his tinnitus worse. Patient says he feels as if it may be sweat in it.    EAR PAIN Duration: {Blank single:19197::"days","weeks","months"} Involved ear(s): {Blank single:19197::"left","right","bilateral"} Severity:  {Blank single:19197::"mild","moderate","severe","1/10","2/10","3/10","4/10","5/10","6/10","7/10","8/10","9/10","10/10"}  Quality:  {Blank multiple:19196::"sharp","dull","aching","burning","cramping","ill-defined","itchy","pressure-like","pulling","shooting","sore","stabbing","tender","tearing","throbbing"} Fever: {Blank single:19197::"yes","no"} Otorrhea: {Blank single:19197::"yes","no"} Upper respiratory infection symptoms: {Blank single:19197::"yes","no"} Pruritus: {Blank single:19197::"yes","no"} Hearing loss: {Blank single:19197::"yes","no"} Water immersion {Blank single:19197::"yes","no"} Using Q-tips: {Blank single:19197::"yes","no"} Recurrent otitis media: {Blank single:19197::"yes","no"} Status: {Blank multiple:19196::"better","worse","stable","fluctuating"} Treatments attempted: {Blank single:19197::"none","pseudoephedrine"}   Relevant past medical, surgical, family and social history reviewed and updated as indicated. Interim medical history since our last visit reviewed. Allergies and medications reviewed and updated.  Review of Systems  Per HPI unless  specifically indicated above     Objective:    BP 124/72 (BP Location: Left Arm, Cuff Size: Normal)   Pulse 79   Temp 98.2 F (36.8 C) (Oral)   Ht 5\' 9"  (1.753 m)   Wt (!) 321 lb (145.6 kg)   SpO2 97%   BMI 47.40 kg/m   Wt Readings from Last 3 Encounters:  12/15/21 (!) 321 lb (145.6 kg)  04/14/21 (!) 322 lb 6.4 oz (146.2 kg)  06/07/18 293 lb (132.9 kg)    Physical Exam  Results for orders placed or performed in visit on 04/14/21  Influenza A & B (STAT)  Result Value Ref Range   Influenza A Negative Negative   Influenza B Negative Negative      Assessment & Plan:   Problem List Items Addressed This Visit   None    Follow up plan: No follow-ups on file.

## 2021-12-15 NOTE — Patient Instructions (Signed)
Otitis Media, Adult  Otitis media is a condition in which the middle ear is red and swollen (inflamed) and full of fluid. The middle ear is the part of the ear that contains bones for hearing as well as air that helps send sounds to the brain. The condition usually goes away on its own. What are the causes? This condition is caused by a blockage in the eustachian tube. This tube connects the middle ear to the back of the nose. It normally allows air into the middle ear. The blockage is caused by fluid or swelling. Problems that can cause blockage include: A cold or infection that affects the nose, mouth, or throat. Allergies. An irritant, such as tobacco smoke. Adenoids that have become large. The adenoids are soft tissue located in the back of the throat, behind the nose and the roof of the mouth. Growth or swelling in the upper part of the throat, just behind the nose (nasopharynx). Damage to the ear caused by a change in pressure. This is called barotrauma. What increases the risk? You are more likely to develop this condition if you: Smoke or are exposed to tobacco smoke. Have an opening in the roof of your mouth (cleft palate). Have acid reflux. Have problems in your body's defense system (immune system). What are the signs or symptoms? Symptoms of this condition include: Ear pain. Fever. Problems with hearing. Being tired. Fluid leaking from the ear. Ringing in the ear. How is this treated? This condition can go away on its own within 3-5 days. But if the condition is caused by germs (bacteria) and does not go away on its own, or if it keeps coming back, your doctor may: Give you antibiotic medicines. Give you medicines for pain. Follow these instructions at home: Take over-the-counter and prescription medicines only as told by your doctor. If you were prescribed an antibiotic medicine, take it as told by your doctor. Do not stop taking it even if you start to feel better. Keep  all follow-up visits. Contact a doctor if: You have bleeding from your nose. There is a lump on your neck. You are not feeling better in 5 days. You feel worse instead of better. Get help right away if: You have pain that is not helped with medicine. You have swelling, redness, or pain around your ear. You get a stiff neck. You cannot move part of your face (paralysis). You notice that the bone behind your ear hurts when you touch it. You get a very bad headache. Summary Otitis media means that the middle ear is red, swollen, and full of fluid. This condition usually goes away on its own. If the problem does not go away, treatment may be needed. You may be given medicines to treat the infection or to treat your pain. If you were prescribed an antibiotic medicine, take it as told by your doctor. Do not stop taking it even if you start to feel better. Keep all follow-up visits. This information is not intended to replace advice given to you by your health care provider. Make sure you discuss any questions you have with your health care provider. Document Revised: 08/26/2020 Document Reviewed: 08/26/2020 Elsevier Patient Education  2023 [employer] Inc[employer]  

## 2021-12-20 NOTE — Patient Instructions (Signed)
Otitis Media, Adult  Otitis media is a condition in which the middle ear is red and swollen (inflamed) and full of fluid. The middle ear is the part of the ear that contains bones for hearing as well as air that helps send sounds to the brain. The condition usually goes away on its own. What are the causes? This condition is caused by a blockage in the eustachian tube. This tube connects the middle ear to the back of the nose. It normally allows air into the middle ear. The blockage is caused by fluid or swelling. Problems that can cause blockage include: A cold or infection that affects the nose, mouth, or throat. Allergies. An irritant, such as tobacco smoke. Adenoids that have become large. The adenoids are soft tissue located in the back of the throat, behind the nose and the roof of the mouth. Growth or swelling in the upper part of the throat, just behind the nose (nasopharynx). Damage to the ear caused by a change in pressure. This is called barotrauma. What increases the risk? You are more likely to develop this condition if you: Smoke or are exposed to tobacco smoke. Have an opening in the roof of your mouth (cleft palate). Have acid reflux. Have problems in your body's defense system (immune system). What are the signs or symptoms? Symptoms of this condition include: Ear pain. Fever. Problems with hearing. Being tired. Fluid leaking from the ear. Ringing in the ear. How is this treated? This condition can go away on its own within 3-5 days. But if the condition is caused by germs (bacteria) and does not go away on its own, or if it keeps coming back, your doctor may: Give you antibiotic medicines. Give you medicines for pain. Follow these instructions at home: Take over-the-counter and prescription medicines only as told by your doctor. If you were prescribed an antibiotic medicine, take it as told by your doctor. Do not stop taking it even if you start to feel better. Keep  all follow-up visits. Contact a doctor if: You have bleeding from your nose. There is a lump on your neck. You are not feeling better in 5 days. You feel worse instead of better. Get help right away if: You have pain that is not helped with medicine. You have swelling, redness, or pain around your ear. You get a stiff neck. You cannot move part of your face (paralysis). You notice that the bone behind your ear hurts when you touch it. You get a very bad headache. Summary Otitis media means that the middle ear is red, swollen, and full of fluid. This condition usually goes away on its own. If the problem does not go away, treatment may be needed. You may be given medicines to treat the infection or to treat your pain. If you were prescribed an antibiotic medicine, take it as told by your doctor. Do not stop taking it even if you start to feel better. Keep all follow-up visits. This information is not intended to replace advice given to you by your health care provider. Make sure you discuss any questions you have with your health care provider. Document Revised: 08/26/2020 Document Reviewed: 08/26/2020 Elsevier Patient Education  2023 [employer] Inc[employer]  

## 2021-12-22 ENCOUNTER — Ambulatory Visit: Payer: BC Managed Care – PPO | Admitting: Nurse Practitioner

## 2021-12-22 ENCOUNTER — Encounter: Payer: Self-pay | Admitting: Nurse Practitioner

## 2021-12-22 DIAGNOSIS — H66001 Acute suppurative otitis media without spontaneous rupture of ear drum, right ear: Secondary | ICD-10-CM | POA: Diagnosis not present

## 2021-12-22 MED ORDER — PREDNISONE 20 MG PO TABS: 40.0000 mg | ORAL_TABLET | Freq: Every day | ORAL | 0 refills | Status: AC

## 2021-12-22 MED ORDER — CEFPODOXIME PROXETIL 200 MG PO TABS: 200.0000 mg | ORAL_TABLET | Freq: Two times a day (BID) | ORAL | 0 refills | Status: AC

## 2021-12-22 MED ORDER — CIPROFLOXACIN-DEXAMETHASONE 0.3-0.1 % OT SUSP: 4.0000 [drp] | Freq: Two times a day (BID) | OTIC | 0 refills | Status: AC

## 2021-12-22 NOTE — Assessment & Plan Note (Signed)
Acute and ongoing with some improvement, but not 100% and there appears to be yeast like drainage in canal now.  Will extend Vantin for 3 more days and send in Ciprodex for canal + 5 days of Prednisone due to ongoing effusion since has upcoming surgery for carpal tunnel.  Educated patient on this.  Return in 2 weeks.

## 2021-12-22 NOTE — Progress Notes (Signed)
BP 131/73   Pulse 88   Temp 98.2 F (36.8 C) (Oral)   Ht 5\' 9"  (1.753 m)   Wt (!) 323 lb 12.8 oz (146.9 kg)   SpO2 97%   BMI 47.82 kg/m    Subjective:    Patient ID: Craig Guerra, male    DOB: 01/27/1986, 36 y.o.   MRN: [id number]  HPI: Craig Guerra is a 36 y.o. male  Chief Complaint  Patient presents with   Otitis Media    Patient is here for one week follow up on Otitis Media. Patient says he has completed his course of treatment this morning and he is still experiencing pressure and still feels clogged up and cannot hear, but the pain is down to a level 3. Patient says he is having surgery Monday and he is not allowed to be on any prescriptions at the time.    EAR PAIN Pain in the R ear that started Friday, July 14th,  and treated with [facility] which he has tolerated well, took last one this morning.  Receives all primary care at [facility]. Duration: days Involved ear(s): right Severity:  2/10 Quality: pressure and fullness Fever: no Otorrhea: no Upper respiratory infection symptoms: no Pruritus: yes Hearing loss: yes Water immersion no Using Q-tips: yes Recurrent otitis media: no Status: stable -- not 100% at this time Treatments attempted: Vantin  Relevant past medical, surgical, family and social history reviewed and updated as indicated. Interim medical history since our last visit reviewed. Allergies and medications reviewed and updated.  Review of Systems  Constitutional:  Negative for chills and fever.  HENT:  Positive for ear pain, hearing loss and tinnitus. Negative for congestion, ear discharge, postnasal drip, rhinorrhea, sinus pressure and sinus pain.   Eyes:  Negative for redness and itching.  Respiratory:  Negative for cough, chest tightness and shortness of breath.   Cardiovascular:  Negative for chest pain, palpitations and leg swelling.  Neurological: Negative.     Per HPI unless specifically indicated above     Objective:    BP 131/73    Pulse 88   Temp 98.2 F (36.8 C) (Oral)   Ht 5\' 9"  (1.753 m)   Wt (!) 323 lb 12.8 oz (146.9 kg)   SpO2 97%   BMI 47.82 kg/m   Wt Readings from Last 3 Encounters:  12/22/21 (!) 323 lb 12.8 oz (146.9 kg)  12/15/21 (!) 321 lb (145.6 kg)  04/14/21 (!) 322 lb 6.4 oz (146.2 kg)    Physical Exam Vitals and nursing note reviewed.  Constitutional:      General: He is awake. He is not in acute distress.    Appearance: Normal appearance. He is well-groomed. He is obese. He is not ill-appearing or toxic-appearing.  HENT:     Head: Normocephalic and atraumatic.     Right Ear: Decreased hearing noted. No drainage, swelling or tenderness. There is no impacted cerumen. Tympanic membrane is injected. Tympanic membrane is not perforated.     Left Ear: Hearing and tympanic membrane normal.     Ears:     Comments: Right canal with yeast like appearance and erythema to canal and TM with mild effusion, TM intact.    Nose: Nose normal.     Mouth/Throat:     Lips: Pink.     Mouth: Mucous membranes are moist.     Pharynx: Oropharynx is clear. Uvula midline. No posterior oropharyngeal erythema.  Eyes:     General: Lids are  normal.        Right eye: No discharge.        Left eye: No discharge.     Pupils: Pupils are equal, round, and reactive to light.  Neck:     Thyroid: No thyromegaly.     Vascular: No carotid bruit.  Cardiovascular:     Rate and Rhythm: Normal rate and regular rhythm.     Heart sounds: Normal heart sounds. No murmur heard.    No gallop.  Pulmonary:     Effort: Pulmonary effort is normal. No accessory muscle usage or respiratory distress.     Breath sounds: Normal breath sounds.  Abdominal:     General: Bowel sounds are normal. There is no distension.     Tenderness: There is no abdominal tenderness.  Musculoskeletal:     Right lower leg: No edema.     Left lower leg: No edema.  Lymphadenopathy:     Head:     Right side of head: No submental, submandibular, tonsillar,  preauricular or posterior auricular adenopathy.     Left side of head: No submental, submandibular, tonsillar, preauricular or posterior auricular adenopathy.     Cervical: No cervical adenopathy.  Neurological:     Mental Status: He is alert.  Psychiatric:        Attention and Perception: Attention normal.        Mood and Affect: Mood normal.        Speech: Speech normal.        Behavior: Behavior normal. Behavior is cooperative.        Thought Content: Thought content normal.    Results for orders placed or performed in visit on 04/14/21  Influenza A & B (STAT)  Result Value Ref Range   Influenza A Negative Negative   Influenza B Negative Negative      Assessment & Plan:   Problem List Items Addressed This Visit       Nervous and Auditory   Otitis media of right ear    Acute and ongoing with some improvement, but not 100% and there appears to be yeast like drainage in canal now.  Will extend Vantin for 3 more days and send in Ciprodex for canal + 5 days of Prednisone due to ongoing effusion since has upcoming surgery for carpal tunnel.  Educated patient on this.  Return in 2 weeks.      Relevant Medications   cefpodoxime (VANTIN) 200 MG tablet     Follow up plan: Return in about 2 weeks (around 01/05/2022) for OTITIS MEDIA.

## 2021-12-29 DIAGNOSIS — G5602 Carpal tunnel syndrome, left upper limb: Secondary | ICD-10-CM | POA: Diagnosis not present

## 2022-01-04 NOTE — Patient Instructions (Signed)
Otitis Media, Adult  Otitis media is a condition in which the middle ear is red and swollen (inflamed) and full of fluid. The middle ear is the part of the ear that contains bones for hearing as well as air that helps send sounds to the brain. The condition usually goes away on its own. What are the causes? This condition is caused by a blockage in the eustachian tube. This tube connects the middle ear to the back of the nose. It normally allows air into the middle ear. The blockage is caused by fluid or swelling. Problems that can cause blockage include: A cold or infection that affects the nose, mouth, or throat. Allergies. An irritant, such as tobacco smoke. Adenoids that have become large. The adenoids are soft tissue located in the back of the throat, behind the nose and the roof of the mouth. Growth or swelling in the upper part of the throat, just behind the nose (nasopharynx). Damage to the ear caused by a change in pressure. This is called barotrauma. What increases the risk? You are more likely to develop this condition if you: Smoke or are exposed to tobacco smoke. Have an opening in the roof of your mouth (cleft palate). Have acid reflux. Have problems in your body's defense system (immune system). What are the signs or symptoms? Symptoms of this condition include: Ear pain. Fever. Problems with hearing. Being tired. Fluid leaking from the ear. Ringing in the ear. How is this treated? This condition can go away on its own within 3-5 days. But if the condition is caused by germs (bacteria) and does not go away on its own, or if it keeps coming back, your doctor may: Give you antibiotic medicines. Give you medicines for pain. Follow these instructions at home: Take over-the-counter and prescription medicines only as told by your doctor. If you were prescribed an antibiotic medicine, take it as told by your doctor. Do not stop taking it even if you start to feel better. Keep  all follow-up visits. Contact a doctor if: You have bleeding from your nose. There is a lump on your neck. You are not feeling better in 5 days. You feel worse instead of better. Get help right away if: You have pain that is not helped with medicine. You have swelling, redness, or pain around your ear. You get a stiff neck. You cannot move part of your face (paralysis). You notice that the bone behind your ear hurts when you touch it. You get a very bad headache. Summary Otitis media means that the middle ear is red, swollen, and full of fluid. This condition usually goes away on its own. If the problem does not go away, treatment may be needed. You may be given medicines to treat the infection or to treat your pain. If you were prescribed an antibiotic medicine, take it as told by your doctor. Do not stop taking it even if you start to feel better. Keep all follow-up visits. This information is not intended to replace advice given to you by your health care provider. Make sure you discuss any questions you have with your health care provider. Document Revised: 08/26/2020 Document Reviewed: 08/26/2020 Elsevier Patient Education  2023 [employer] Inc[employer]  

## 2022-01-08 ENCOUNTER — Encounter: Payer: Self-pay | Admitting: Nurse Practitioner

## 2022-01-08 ENCOUNTER — Ambulatory Visit (INDEPENDENT_AMBULATORY_CARE_PROVIDER_SITE_OTHER): Payer: BC Managed Care – PPO | Admitting: Nurse Practitioner

## 2022-01-08 DIAGNOSIS — H66001 Acute suppurative otitis media without spontaneous rupture of ear drum, right ear: Secondary | ICD-10-CM

## 2022-01-08 NOTE — Progress Notes (Signed)
BP 122/76   Pulse 81   Temp 98.1 F (36.7 C) (Oral)   Ht 5\' 9"  (1.753 m)   Wt (!) 322 lb 3.2 oz (146.1 kg)   SpO2 96%   BMI 47.58 kg/m    Subjective:    Patient ID: Craig Guerra, male    DOB: [birth year], 36 y.o.   MRN: [id number]  HPI: Craig Guerra is a 36 y.o. male  Chief Complaint  Patient presents with   Otitis Media    Patient is here for two week follow up on Otitis Media. Patient says he does not have any pain, but his ears still feel clogged up.    EAR PAIN Pain in the R ear that started Friday, July 14th,  and treated with Vantin initially which he has tolerated well, completed this course.  At visit 12/22/21 was still having ear discomfort, extended Vantin and added Ciprodex + 5 days Prednisone due to effusion and erythema. Receives all primary care at 12/24/21.  He reports pain has improved, but ears feel clogged.  Has underlying tinnitus at baseline and is followed at [place]. Duration: weeks Fever: no Otorrhea: no Upper respiratory infection symptoms: no Pruritus: no Hearing loss: yes Water immersion no Using Q-tips: no Recurrent otitis media: no Status: better Treatments attempted: as above  Relevant past medical, surgical, family and social history reviewed and updated as indicated. Interim medical history since our last visit reviewed. Allergies and medications reviewed and updated.  Review of Systems  Constitutional:  Negative for chills and fever.  HENT:  Positive for hearing loss (baseline) and tinnitus (baseline). Negative for congestion, ear discharge, ear pain, postnasal drip, rhinorrhea, sinus pressure and sinus pain.   Eyes:  Negative for redness and itching.  Respiratory:  Negative for cough, chest tightness and shortness of breath.   Cardiovascular:  Negative for chest pain, palpitations and leg swelling.  Neurological: Negative.     Per HPI unless specifically indicated above     Objective:    BP 122/76   Pulse 81   Temp 98.1 F (36.7  C) (Oral)   Ht 5\' 9"  (1.753 m)   Wt (!) 322 lb 3.2 oz (146.1 kg)   SpO2 96%   BMI 47.58 kg/m   Wt Readings from Last 3 Encounters:  01/08/22 (!) 322 lb 3.2 oz (146.1 kg)  12/22/21 (!) 323 lb 12.8 oz (146.9 kg)  12/15/21 (!) 321 lb (145.6 kg)    Physical Exam Vitals and nursing note reviewed.  Constitutional:      General: He is awake. He is not in acute distress.    Appearance: Normal appearance. He is well-groomed. He is obese. He is not ill-appearing or toxic-appearing.  HENT:     Head: Normocephalic and atraumatic.     Right Ear: Decreased hearing noted. No swelling. No middle ear effusion. Tympanic membrane is not injected or perforated.     Left Ear: Decreased hearing noted. No swelling.  No middle ear effusion. Tympanic membrane is not injected or perforated.     Ears:     Comments: TM visualized both sides and overall improved with less erythema in canal.    Nose: Nose normal.     Mouth/Throat:     Lips: Pink.     Mouth: Mucous membranes are moist.     Pharynx: Oropharynx is clear. Uvula midline. No posterior oropharyngeal erythema.  Eyes:     General: Lids are normal.        Right  eye: No discharge.        Left eye: No discharge.     Pupils: Pupils are equal, round, and reactive to light.  Neck:     Thyroid: No thyromegaly.     Vascular: No carotid bruit.  Cardiovascular:     Rate and Rhythm: Normal rate and regular rhythm.     Heart sounds: Normal heart sounds. No murmur heard.    No gallop.  Pulmonary:     Effort: Pulmonary effort is normal. No accessory muscle usage or respiratory distress.     Breath sounds: Normal breath sounds.  Abdominal:     General: Bowel sounds are normal. There is no distension.     Tenderness: There is no abdominal tenderness.  Musculoskeletal:     Right lower leg: No edema.     Left lower leg: No edema.  Lymphadenopathy:     Head:     Right side of head: No submental, submandibular, tonsillar, preauricular or posterior auricular  adenopathy.     Left side of head: No submental, submandibular, tonsillar, preauricular or posterior auricular adenopathy.     Cervical: No cervical adenopathy.  Neurological:     Mental Status: He is alert.  Psychiatric:        Attention and Perception: Attention normal.        Mood and Affect: Mood normal.        Speech: Speech normal.        Behavior: Behavior normal. Behavior is cooperative.        Thought Content: Thought content normal.    Results for orders placed or performed in visit on 04/14/21  Influenza A & B (STAT)  Result Value Ref Range   Influenza A Negative Negative   Influenza B Negative Negative      Assessment & Plan:   Problem List Items Addressed This Visit       Nervous and Auditory   Otitis media of right ear    Acute and improved at this time.  Recommend he continue follow-up at [facility] for ongoing tinnitus and hearing loss, which is monitored closely there.          Follow up plan: Return if symptoms worsen or fail to improve.

## 2022-01-08 NOTE — Assessment & Plan Note (Signed)
Acute and improved at this time.  Recommend he continue follow-up at [facility] for ongoing tinnitus and hearing loss, which is monitored closely there.

## 2022-02-06 ENCOUNTER — Encounter: Payer: Self-pay | Admitting: Nurse Practitioner

## 2022-02-06 ENCOUNTER — Ambulatory Visit: Payer: BC Managed Care – PPO | Admitting: Nurse Practitioner

## 2022-02-06 DIAGNOSIS — H60331 Swimmer's ear, right ear: Secondary | ICD-10-CM

## 2022-02-06 MED ORDER — CEFPODOXIME PROXETIL 100 MG PO TABS: 100.0000 mg | ORAL_TABLET | Freq: Two times a day (BID) | ORAL | 0 refills | Status: AC

## 2022-02-06 MED ORDER — CIPROFLOXACIN-DEXAMETHASONE 0.3-0.1 % OT SUSP: 4.0000 [drp] | Freq: Two times a day (BID) | OTIC | 0 refills | Status: AC

## 2022-02-06 NOTE — Patient Instructions (Signed)
Otitis Externa  Otitis externa is an infection of the outer ear canal. The outer ear canal is the area between the outside of the ear and the eardrum. Otitis externa is sometimes called swimmer's ear. What are the causes? Common causes of this condition include: Swimming in dirty water. Moisture in the ear. An injury to the inside of the ear. An object stuck in the ear. A cut or scrape on the outside of the ear or in the ear canal. What increases the risk? You are more likely to get this condition if you go swimming often. What are the signs or symptoms? Itching in the ear. This is often the first symptom. Swelling of the ear. Redness in the ear. Ear pain. The pain may get worse when you pull on your ear. Pus coming from the ear. How is this treated? This condition may be treated with: Antibiotic ear drops. These are often given for 10-14 days. Medicines to reduce itching and swelling. Follow these instructions at home: If you were prescribed antibiotic ear drops, use them as told by your doctor. Do not stop using them even if you start to feel better. Take over-the-counter and prescription medicines only as told by your doctor. Avoid getting water in your ears as told by your doctor. You may be told to avoid swimming or water sports for a few days. Keep all follow-up visits. How is this prevented? Keep your ears dry. Use the corner of a towel to dry your ears after you swim or bathe. Try not to scratch or put things in your ear. Doing these things makes it easier for germs to grow in your ear. Avoid swimming in lakes, dirty water, or swimming pools that may not have the right amount of a chemical called chlorine. Contact a doctor if: You have a fever. Your ear is still red, swollen, or painful after 3 days. You still have pus coming from your ear after 3 days. Your redness, swelling, or pain gets worse. You have a very bad headache. Get help right away if: You have redness,  swelling, and pain or tenderness behind your ear. Summary Otitis externa is an infection of the outer ear canal. Symptoms include pain, redness, and swelling of the ear. If you were prescribed antibiotic ear drops, use them as told by your doctor. Do not stop using them even if you start to feel better. Try not to scratch or put things in your ear. This information is not intended to replace advice given to you by your health care provider. Make sure you discuss any questions you have with your health care provider. Document Revised: 07/31/2020 Document Reviewed: 07/31/2020 Elsevier Patient Education  2023 [employer]  

## 2022-02-06 NOTE — Progress Notes (Signed)
BP 118/74   Pulse 76   Temp 98.2 F (36.8 C) (Oral)   Wt (!) 319 lb 3.2 oz (144.8 kg)   SpO2 96%   BMI 47.14 kg/m    Subjective:    Patient ID: Craig Guerra, male    DOB: [birth year], 36 y.o.   MRN: [id number]  HPI: Craig Guerra is a 36 y.o. male  Chief Complaint  Patient presents with   Otitis Media    Patient is here for second possible ear infection.    Otitis Externa    Patient says he thinks he may have another ear infection in R ear. Patient says Tuesday symptoms started and then Wednesday he has a little pain and discomfort, but since then he has noticed a lot of itchiness. Patient says he went swimming on Monday and noticed symptoms on Tuesday. Patient says he removed a lot of gunk out of his ear last night.    EAR PAIN Right ear pain started on Tuesday.  Recently treated one month ago for ear infection. Duration: days Involved ear(s): right Severity:  mild itchy Quality: itchiness Fever: no Otorrhea: no Upper respiratory infection symptoms: no Pruritus: yes Hearing loss:  a little  Water immersion yes swimming Monday Using Q-tips: yes yesterday Recurrent otitis media: no Status: fluctuating Treatments attempted: drops Wednesday one day worth  Relevant past medical, surgical, family and social history reviewed and updated as indicated. Interim medical history since our last visit reviewed. Allergies and medications reviewed and updated.  Review of Systems  Constitutional:  Negative for chills and fever.  HENT:  Positive for ear pain, hearing loss and tinnitus. Negative for congestion, ear discharge, postnasal drip, rhinorrhea, sinus pressure and sinus pain.   Eyes:  Negative for redness and itching.  Respiratory:  Negative for cough, chest tightness and shortness of breath.   Cardiovascular:  Negative for chest pain, palpitations and leg swelling.  Neurological: Negative.     Per HPI unless specifically indicated above     Objective:    BP  118/74   Pulse 76   Temp 98.2 F (36.8 C) (Oral)   Wt (!) 319 lb 3.2 oz (144.8 kg)   SpO2 96%   BMI 47.14 kg/m   Wt Readings from Last 3 Encounters:  02/06/22 (!) 319 lb 3.2 oz (144.8 kg)  01/08/22 (!) 322 lb 3.2 oz (146.1 kg)  12/22/21 (!) 323 lb 12.8 oz (146.9 kg)    Physical Exam Vitals and nursing note reviewed.  Constitutional:      General: He is awake. He is not in acute distress.    Appearance: Normal appearance. He is well-groomed. He is obese. He is not ill-appearing or toxic-appearing.  HENT:     Head: Normocephalic and atraumatic.     Right Ear: Decreased hearing noted. No drainage, swelling or tenderness. There is no impacted cerumen. Tympanic membrane is injected. Tympanic membrane is not perforated.     Left Ear: Hearing and tympanic membrane normal.     Ears:     Comments: Right canal with moderate erythema/edema to canal and TM with mild effusion, TM intact.    Nose: Nose normal.     Mouth/Throat:     Lips: Pink.     Mouth: Mucous membranes are moist.     Pharynx: Oropharynx is clear. Uvula midline. No posterior oropharyngeal erythema.  Eyes:     General: Lids are normal.        Right eye: No discharge.  Left eye: No discharge.     Pupils: Pupils are equal, round, and reactive to light.  Neck:     Thyroid: No thyromegaly.     Vascular: No carotid bruit.  Cardiovascular:     Rate and Rhythm: Normal rate and regular rhythm.     Heart sounds: Normal heart sounds. No murmur heard.    No gallop.  Pulmonary:     Effort: Pulmonary effort is normal. No accessory muscle usage or respiratory distress.     Breath sounds: Normal breath sounds.  Abdominal:     General: Bowel sounds are normal. There is no distension.     Tenderness: There is no abdominal tenderness.  Musculoskeletal:     Right lower leg: No edema.     Left lower leg: No edema.  Lymphadenopathy:     Head:     Right side of head: No submental, submandibular, tonsillar, preauricular or  posterior auricular adenopathy.     Left side of head: No submental, submandibular, tonsillar, preauricular or posterior auricular adenopathy.     Cervical: No cervical adenopathy.  Neurological:     Mental Status: He is alert.  Psychiatric:        Attention and Perception: Attention normal.        Mood and Affect: Mood normal.        Speech: Speech normal.        Behavior: Behavior normal. Behavior is cooperative.        Thought Content: Thought content normal.     Results for orders placed or performed in visit on 04/14/21  Influenza A & B (STAT)  Result Value Ref Range   Influenza A Negative Negative   Influenza B Negative Negative      Assessment & Plan:   Problem List Items Addressed This Visit       Nervous and Auditory   Otitis externa    Acute and ongoing since went swimming on Monday.  Will start Vantin as TM with moderate erythema and cloudiness + send in Ciprodex for canal both for 7 days.  Educated patient on this.  Return if worsening or ongoing symptoms.  Wear ear plugs when swimming.        Follow up plan: Return if symptoms worsen or fail to improve.

## 2022-02-06 NOTE — Assessment & Plan Note (Signed)
Acute and ongoing since went swimming on Monday.  Will start Vantin as TM with moderate erythema and cloudiness + send in Ciprodex for canal both for 7 days.  Educated patient on this.  Return if worsening or ongoing symptoms.  Wear ear plugs when swimming.

## 2022-02-23 ENCOUNTER — Telehealth (INDEPENDENT_AMBULATORY_CARE_PROVIDER_SITE_OTHER): Payer: BC Managed Care – PPO | Admitting: Nurse Practitioner

## 2022-02-23 ENCOUNTER — Ambulatory Visit: Payer: Self-pay

## 2022-02-23 ENCOUNTER — Encounter: Payer: Self-pay | Admitting: Nurse Practitioner

## 2022-02-23 DIAGNOSIS — J029 Acute pharyngitis, unspecified: Secondary | ICD-10-CM

## 2022-02-23 LAB — VERITOR FLU A/B WAIVED: Influenza B: NEGATIVE

## 2022-02-23 MED ORDER — CEFPODOXIME PROXETIL 100 MG PO TABS: 100.0000 mg | ORAL_TABLET | Freq: Two times a day (BID) | ORAL | 0 refills | Status: AC

## 2022-02-23 MED ORDER — PREDNISONE 20 MG PO TABS: 40.0000 mg | ORAL_TABLET | Freq: Every day | ORAL | 0 refills | Status: AC

## 2022-02-23 MED ORDER — HYDROCOD POLI-CHLORPHE POLI ER 10-8 MG/5ML PO SUER: 5.0000 mL | Freq: Every evening | ORAL | 0 refills | Status: AC | PRN

## 2022-02-23 MED ORDER — ALBUTEROL SULFATE HFA 108 (90 BASE) MCG/ACT IN AERS: 2.0000 | INHALATION_SPRAY | Freq: Four times a day (QID) | RESPIRATORY_TRACT | 0 refills | Status: AC | PRN

## 2022-02-23 MED ORDER — LIDOCAINE VISCOUS HCL 2 % MT SOLN: 15.0000 mL | OROMUCOSAL | 0 refills | Status: AC | PRN

## 2022-02-23 NOTE — Patient Instructions (Signed)

## 2022-02-23 NOTE — Telephone Encounter (Signed)
  Chief Complaint: chest soreness Symptoms: sore throat, fever, body aches, chest congestion, sweating Frequency: onset yest, negative covid test Pertinent Negatives: Patient denies no wheezing, no diarrhea Disposition: [] ED /[] Urgent Care (no appt availability in office) / [x] Appointment(In office/virtual)/ []  [facility]/ [] Home Care/ [] Refused Recommended Disposition /[] [facility]/ []  Follow-up with PCP Additional Notes: Advised pt to use humidifier, mask and wash hands, salt water gargle, cough  drops, wear mask around others. Reason for Disposition  MILD difficulty breathing (e.g., minimal/no SOB at rest, SOB with walking, pulse <100)  Answer Assessment - Initial Assessment Questions 1. COVID-19 DIAGNOSIS: "How do you know that you have COVID?" (e.g., positive lab test or self-test, diagnosed by doctor or NP/PA, symptoms after exposure).     neg 2. COVID-19 EXPOSURE: "Was there any known exposure to COVID before the symptoms began?" CDC Definition of close contact: within 6 feet (2 meters) for a total of 15 minutes or more over a 24-hour period.      N/a 3. ONSET: "When did the COVID-19 symptoms start?"      yesterday 4. WORST SYMPTOM: "What is your worst symptom?" (e.g., cough, fever, shortness of breath, muscle aches)     Chest soreness and cough 5. COUGH: "Do you have a cough?" If Yes, ask: "How bad is the cough?"       Yes- frequent coughing  phlegm yellow 6. FEVER: "Do you have a fever?" If Yes, ask: "What is your temperature, how was it measured, and when did it start?"     Yes 101.2 7. RESPIRATORY STATUS: "Describe your breathing?" (e.g., normal; shortness of breath, wheezing, unable to speak)      Mild SOB 8. BETTER-SAME-WORSE: "Are you getting better, staying the same or getting worse compared to yesterday?"  If getting worse, ask, "In what way?"     worse 9. OTHER SYMPTOMS: "Do you have any other symptoms?"  (e.g., chills, fatigue, headache, loss of  smell or taste, muscle pain, sore throat)     Chills, muscle pain, sweating. Fever, sore throat 10. HIGH RISK DISEASE: "Do you have any chronic medical problems?" (e.g., asthma, heart or lung disease, weak immune system, obesity, etc.)       no 11. VACCINE: "Have you had the COVID-19 vaccine?" If Yes, ask: "Which one, how many shots, when did you get it?"       N/a 12. PREGNANCY: "Is there any chance you are pregnant?" "When was your last menstrual period?"       N/a 13. O2 SATURATION MONITOR:  "Do you use an oxygen saturation monitor (pulse oximeter) at home?" If Yes, ask "What is your reading (oxygen level) today?" "What is your usual oxygen saturation reading?" (e.g., 95%)       N/a  Protocols used: Coronavirus (COVID-19) Diagnosed or Suspected-A-AH

## 2022-02-23 NOTE — Progress Notes (Signed)
There were no vitals taken for this visit.   Subjective:    Patient ID: Craig Guerra, male    DOB: 08-17-1985, 36 y.o.   MRN: [id number]  HPI: Craig Guerra is a 36 y.o. male  Chief Complaint  Patient presents with   Sore Throat   Headache   Chills   Otalgia   Fever   Generalized Body Aches   Cough    Patient says he started with a cough Saturday evening. Patient says he has been taking Day-Quil and it doesn't seem to help, just numbs the cough and fever a little bit. Patient says his fever was 100.2. Patient says he took at home COVID last night and it was negative. Patient says his son has the same symptoms and he is in school.    This visit was completed via video visit through MyChart due to the restrictions of the COVID-19 pandemic. All issues as above were discussed and addressed. Physical exam was done as above through visual confirmation on video through MyChart. If it was felt that the patient should be evaluated in the office, they were directed there. The patient verbally consented to this visit. Location of the patient: home Location of the provider: work Those involved with this call:  Provider: Aura Dials, DNP CMA: Malen Gauze, CMA Front Desk/Registration: [employer]  Time spent on call:  21 minutes with patient face to face via video conference. More than 50% of this time was spent in counseling and coordination of care. 15 minutes total spent in review of patient's record and preparation of their chart.  I verified patient identity using two factors (patient name and date of birth). Patient consents verbally to being seen via telemedicine visit today.    UPPER RESPIRATORY TRACT INFECTION Started feeling bad Saturday night with cough, after hunting with his son that day.  Did Covid test at home and it was negative. Son has a sore throat now too. Fever: yes Cough: yes Shortness of breath: yes Wheezing: yes Chest pain: no Chest tightness:  yes Chest congestion: yes Nasal congestion: yes Runny nose: no Post nasal drip: no Sneezing: no Sore throat: yes Swollen glands: no Sinus pressure: yes Headache: yes Face pain: no Toothache: no Ear pain: none Ear pressure: yes "right Eyes red/itching:no Eye drainage/crusting: no  Vomiting: no Rash: no Fatigue: yes Sick contacts: no Strep contacts: no  Context: fluctuating Recurrent sinusitis: no Relief with OTC cold/cough medications: yes  Treatments attempted: Dayquil    Relevant past medical, surgical, family and social history reviewed and updated as indicated. Interim medical history since our last visit reviewed. Allergies and medications reviewed and updated.  Review of Systems  Constitutional:  Positive for chills, diaphoresis, fatigue and fever. Negative for activity change and appetite change.  HENT:  Positive for congestion, sinus pressure and sore throat. Negative for ear discharge, ear pain, postnasal drip, rhinorrhea, sinus pain and sneezing.   Respiratory:  Positive for cough, chest tightness, shortness of breath and wheezing.   Cardiovascular:  Negative for chest pain, palpitations and leg swelling.  Gastrointestinal: Negative.   Musculoskeletal:  Positive for myalgias.  Neurological:  Positive for headaches.    Per HPI unless specifically indicated above     Objective:    There were no vitals taken for this visit.  Wt Readings from Last 3 Encounters:  02/06/22 (!) 319 lb 3.2 oz (144.8 kg)  01/08/22 (!) 322 lb 3.2 oz (146.1 kg)  12/22/21 (!) 323 lb 12.8  oz (146.9 kg)    Physical Exam Vitals and nursing note reviewed.  Constitutional:      General: He is awake. He is not in acute distress.    Appearance: He is well-developed and well-groomed. He is obese. He is ill-appearing. He is not toxic-appearing.  HENT:     Head: Normocephalic.     Right Ear: Hearing normal. No drainage.     Left Ear: Hearing normal. No drainage.  Eyes:     General: Lids  are normal.        Right eye: No discharge.        Left eye: No discharge.     Conjunctiva/sclera: Conjunctivae normal.  Pulmonary:     Effort: Pulmonary effort is normal. No accessory muscle usage or respiratory distress.  Musculoskeletal:     Cervical back: Normal range of motion.  Neurological:     Mental Status: He is alert and oriented to person, place, and time.  Psychiatric:        Mood and Affect: Mood normal.        Behavior: Behavior normal. Behavior is cooperative.        Thought Content: Thought content normal.        Judgment: Judgment normal.     Results for orders placed or performed in visit on 04/14/21  Influenza A & B (STAT)  Result Value Ref Range   Influenza A Negative Negative   Influenza B Negative Negative      Assessment & Plan:   Problem List Items Addressed This Visit       Other   Sore throat - Primary    For 2 days, Covid negative on testing at home.  Will plan to retest here in office, along with flu and strep testing as son is presenting with similar illness too.  Recommend he self quarantine until all testing returns.  At this time suspect more viral, will start Prednisone 40 MG daily for 5 days, Tussionex, Albuterol inhaler PRN, and viscous lidocaine for sore throat.  Recommend: - Increased rest - Increasing Fluids - Acetaminophen / ibuprofen as needed for fever/pain.  - Salt water gargling, chloraseptic spray and throat lozenges - Mucinex.   - Humidifying the air. Return to office if worsening or ongoing.      Relevant Orders   Novel Coronavirus, NAA ([facility])   Veritor Flu A/B Waived   Rapid Strep Screen ([facility] ONLY)    I discussed the assessment and treatment plan with the patient. The patient was provided an opportunity to ask questions and all were answered. The patient agreed with the plan and demonstrated an understanding of the instructions.   The patient was advised to call back or seek an in-person evaluation if the  symptoms worsen or if the condition fails to improve as anticipated.   I provided 21+ minutes of time during this encounter.    Follow up plan: Return if symptoms worsen or fail to improve.

## 2022-02-23 NOTE — Progress Notes (Signed)
Contacted via [facility] afternoon Craig Guerra, your strep and your flu returned negative, however I hear your son was diagnosed with strep.  Did they swab him for this?  Let me know as we may just go ahead and treat to be cautious.  Any questions?

## 2022-02-23 NOTE — Assessment & Plan Note (Signed)
For 2 days, Covid negative on testing at home.  Will plan to retest here in office, along with flu and strep testing as son is presenting with similar illness too.  Recommend he self quarantine until all testing returns.  At this time suspect more viral, will start Prednisone 40 MG daily for 5 days, Tussionex, Albuterol inhaler PRN, and viscous lidocaine for sore throat.  Recommend: - Increased rest - Increasing Fluids - Acetaminophen / ibuprofen as needed for fever/pain.  - Salt water gargling, chloraseptic spray and throat lozenges - Mucinex.   - Humidifying the air. Return to office if worsening or ongoing.

## 2022-02-24 ENCOUNTER — Encounter: Payer: Self-pay | Admitting: Nurse Practitioner

## 2022-02-24 LAB — NOVEL CORONAVIRUS, NAA: SARS-CoV-2, NAA: DETECTED — AB

## 2022-02-24 NOTE — Progress Notes (Signed)
Contacted via [facility] evening Masayuki, your labs have returned and unfortunately Covid has returned positive.:( You will not be able to return to work until Thursday and will then need to mask for 5 days.  How are you feeling?  We do have medications for Covid now: Paxlovid and Molnupiravir -- which help prevent the virus from spreading to further cells and causing worsening infection.  My son actually tested positive today, it is heading through schools again, and we started him on Paxlovid.  You have 5 days to start this, which would mean tomorrow is last day for you to start.  Do you want me to send this in?  If so I would then stop antibiotic as suspect your infection is more Covid and your son may have Covid too.  I will send you a work note to return Thursday.   Keep being awesome!!  Thank you for allowing me to participate in your care.  I appreciate you. Kindest regards, Michaila Kenney

## 2022-02-25 MED ORDER — MOLNUPIRAVIR EUA 200MG CAPSULE: 4.0000 | ORAL_CAPSULE | Freq: Two times a day (BID) | ORAL | 0 refills | Status: AC

## 2022-02-25 NOTE — Addendum Note (Signed)
Addended by: Marnee Guarneri T on: 02/25/2022 08:12 AM   Modules accepted: Orders

## 2022-02-26 LAB — VERITOR FLU A/B WAIVED: Influenza A: NEGATIVE

## 2022-02-26 LAB — RAPID STREP SCREEN (MED CTR MEBANE ONLY): Strep Gp A Ag, IA W/Reflex: NEGATIVE

## 2022-02-26 LAB — CULTURE, GROUP A STREP: Strep A Culture: NEGATIVE

## 2022-02-26 NOTE — Progress Notes (Signed)
Contacted via MyChart  Strep culture returned negative as well -- so suspect for you more Covid:( My son has it right now too and we are hoping it does not hit whole family:(

## 2022-06-04 ENCOUNTER — Ambulatory Visit: Payer: Self-pay

## 2022-06-04 NOTE — Telephone Encounter (Signed)
Patient called, left VM to return the call to the office to discuss symptoms with a nurse.  Summary: ingrown toenail pain, swelling, discoloration, and pus   Pt stated that he is experiencing pain, swelling, discoloration, and pus on his right foot big toe due to an ingrown toenail. Stated has been going on for a few days.  Pt's PCP does not do toenails.  No appointments are available.   Pt seeking clinical advice.

## 2022-06-04 NOTE — Telephone Encounter (Signed)
  Chief Complaint: Ingrown toenail Symptoms: right foot, great toe, Pus, redness, warmth, swelling. Fever beginning of week Frequency: 2 days ago Pertinent Negatives: Patient denies  Disposition: [] ED /[x] Urgent Care (no appt availability in office) / [] Appointment(In office/virtual)/ []  [facility]/ [] Home Care/ [] Refused Recommended Disposition /[] [facility]/ []  Follow-up with PCP Additional Notes: Advised UC, states will follow disposition. Care advise provided, verbalizes understanding.  Reason for Disposition  [1] Looks infected (spreading redness, red streak, pus) AND [2] fever  Answer Assessment - Initial Assessment Questions 1. LOCATION: "Which toe?"      Big toe right foot 2. APPEARANCE: "What does it look like?"      Red, blistered, pus, warm to touch 3. ONSET: "When did it start?"      2 days ago 4. PAIN: "Is there any pain?" If Yes, ask: "How bad is the pain?"   (Scale 1-10; or mild, moderate, severe)    - NONE (0): none     - MILD (1-3): doesn't interfere with normal activities    - MODERATE (4-7): interferes with normal activities or awakens from sleep    - SEVERE (8-10): excruciating pain, unable to do any normal activities     6/10 with pressure 5. REDNESS: "Is there any redness of the skin?" If Yes, ask: "How much of the toe is red?"     yes 6. OTHER SYMPTOMS: "Do you have any other symptoms?" (e.g., chills, fever, red streak up foot)     Monday fever  Protocols used: Toenail - Ingrown-A-AH

## 2022-06-04 NOTE — Telephone Encounter (Signed)
Noted  

## 2022-07-12 ENCOUNTER — Ambulatory Visit
Admission: EM | Admit: 2022-07-12 | Discharge: 2022-07-12 | Disposition: A | Discharge status: Home / Self Care | Payer: BC Managed Care – PPO | Attending: Family Medicine | Admitting: Family Medicine

## 2022-07-12 ENCOUNTER — Telehealth: Payer: Self-pay | Admitting: Family Medicine

## 2022-07-12 DIAGNOSIS — R509 Fever, unspecified: Secondary | ICD-10-CM | POA: Insufficient documentation

## 2022-07-12 DIAGNOSIS — Z4789 Encounter for other orthopedic aftercare: Secondary | ICD-10-CM | POA: Insufficient documentation

## 2022-07-12 DIAGNOSIS — Z01818 Encounter for other preprocedural examination: Secondary | ICD-10-CM | POA: Insufficient documentation

## 2022-07-12 DIAGNOSIS — Z20828 Contact with and (suspected) exposure to other viral communicable diseases: Secondary | ICD-10-CM | POA: Diagnosis not present

## 2022-07-12 DIAGNOSIS — R197 Diarrhea, unspecified: Secondary | ICD-10-CM | POA: Diagnosis not present

## 2022-07-12 DIAGNOSIS — G5600 Carpal tunnel syndrome, unspecified upper limb: Secondary | ICD-10-CM | POA: Insufficient documentation

## 2022-07-12 DIAGNOSIS — J111 Influenza due to unidentified influenza virus with other respiratory manifestations: Secondary | ICD-10-CM | POA: Diagnosis not present

## 2022-07-12 DIAGNOSIS — Z1152 Encounter for screening for COVID-19: Secondary | ICD-10-CM | POA: Diagnosis not present

## 2022-07-12 LAB — RESP PANEL BY RT-PCR (RSV, FLU A&B, COVID)  RVPGX2
Influenza A by PCR: NEGATIVE
Influenza B by PCR: NEGATIVE
Resp Syncytial Virus by PCR: NEGATIVE
SARS Coronavirus 2 by RT PCR: NEGATIVE

## 2022-07-12 MED ORDER — OSELTAMIVIR PHOSPHATE 75 MG PO CAPS: 75.0000 mg | ORAL_CAPSULE | Freq: Two times a day (BID) | ORAL | 0 refills | Status: AC

## 2022-07-12 MED ORDER — ONDANSETRON 4 MG PO TBDP: 4.0000 mg | ORAL_TABLET | Freq: Three times a day (TID) | ORAL | 0 refills | Status: AC | PRN

## 2022-07-12 NOTE — Telephone Encounter (Signed)
Prescribed Tamiflu for patient as several of his family members have influenza B.   Lyndee Hensen, DO

## 2022-07-12 NOTE — Discharge Instructions (Signed)
We will contact you if your COVID/influenza test is positive.  Please quarantine while you wait for the results.  If your test is negative you may resume normal activities.  If your test is positive please continue to quarantine for at least 5 days from your symptom onset or until you are without a fever for at least 24 hours after the medications.    If your were prescribed medication, stop by the pharmacy to pick them up.   You can take Tylenol and/or Ibuprofen as needed for fever reduction and pain relief.    For cough: honey 1/2 to 1 teaspoon (you can dilute the honey in water or another fluid).  You can also use guaifenesin and dextromethorphan for cough. You can use a humidifier for chest congestion and cough.  If you don't have a humidifier, you can sit in the bathroom with the hot shower running.      For sore throat: try warm salt water gargles, Mucinex sore throat cough drops or cepacol lozenges, throat spray, warm tea or water with lemon/honey, popsicles or ice, or OTC cold relief medicine for throat discomfort. You can also purchase chloraseptic spray at the pharmacy or dollar store.   For congestion: take a daily anti-histamine like Zyrtec, Claritin, and a oral decongestant, such as pseudoephedrine.  You can also use Flonase 1-2 sprays in each nostril daily. Afrin is also a good option, if you do not have high blood pressure.    It is important to stay hydrated: drink plenty of fluids (water, gatorade/powerade/pedialyte, juices, or teas) to keep your throat moisturized and help further relieve irritation/discomfort.    Return or go to the Emergency Department if symptoms worsen or do not improve in the next few days

## 2022-07-12 NOTE — ED Triage Notes (Signed)
Patient presents to UC for  HA, diarrhea, chills, nasal congestion, body aches since yesterday morning, Taking OTC cough med, exposed to flu.

## 2022-07-12 NOTE — ED Provider Notes (Signed)
[facility]    CSN: [id number] Arrival date & time: 07/12/22  1216      History   Chief Complaint Chief Complaint  Patient presents with   Fever   Headache   Diarrhea   Nasal Congestion   Generalized Body Aches    HPI Craig Guerra is a 37 y.o. male.   HPI   Craig Guerra presents for fever, body aches, headache, nausea, nasal congestion sore throat that started yesterday. OTC meds aren't helping. His family members have the flu.     Past Medical History:  Diagnosis Date   Carpal tunnel syndrome, bilateral    Obesity    Sleep apnea     Patient Active Problem List   Diagnosis Date Noted   Carpal tunnel syndrome 07/12/2022   Encounter for other orthopedic aftercare 07/12/2022   Encounter for other preprocedural examination 07/12/2022   Sore throat 02/23/2022   Prediabetes 12/15/2021   Sleep apnea 05/21/2017    Past Surgical History:  Procedure Laterality Date   CARPAL TUNNEL RELEASE Left    KNEE SURGERY Left    LEFT HEART CATH AND CORONARY ANGIOGRAPHY Right 05/18/2017   Procedure: LEFT HEART CATH AND CORONARY ANGIOGRAPHY;  Surgeon: Dionisio David, MD;  Location: [facility];  Service: Cardiovascular;  Laterality: Right;       Home Medications    Prior to Admission medications   Medication Sig Start Date End Date Taking? Authorizing Provider  ondansetron (ZOFRAN-ODT) 4 MG disintegrating tablet Take 1-2 tablets (4-8 mg total) by mouth every 8 (eight) hours as needed. 07/12/22  Yes Anastacio Bua, DO  albuterol (VENTOLIN HFA) 108 (90 Base) MCG/ACT inhaler Inhale 2 puffs into the lungs every 6 (six) hours as needed for wheezing or shortness of breath. 02/23/22   Cannady, Henrine Screws T, NP  chlorpheniramine-HYDROcodone (TUSSIONEX) 10-8 MG/5ML Take 5 mLs by mouth at bedtime as needed for cough. 02/23/22   Cannady, Henrine Screws T, NP  lidocaine (XYLOCAINE) 2 % solution Use as directed 15 mLs in the mouth or throat as needed for mouth pain. 02/23/22    Cannady, Henrine Screws T, NP  oseltamivir (TAMIFLU) 75 MG capsule Take 1 capsule (75 mg total) by mouth every 12 (twelve) hours. 07/12/22   Lyndee Hensen, DO    Family History Family History  Problem Relation Age of Onset   Stroke Mother    Healthy Father     Social History Social History   Tobacco Use   Smoking status: Never   Smokeless tobacco: Never  Vaping Use   Vaping Use: Never used  Substance Use Topics   Alcohol use: Yes    Comment: on very rare occasion    Drug use: No     Allergies   Augmentin [amoxicillin-pot clavulanate]   Review of Systems Review of Systems: negative unless otherwise stated in HPI.      Physical Exam Triage Vital Signs ED Triage Vitals  Enc Vitals Group     BP 07/12/22 1445 (!) 141/84     Pulse Rate 07/12/22 1445 94     Resp 07/12/22 1445 16     Temp 07/12/22 1445 98.1 F (36.7 C)     Temp Source 07/12/22 1445 Oral     SpO2 07/12/22 1445 97 %     Weight --      Height --      Head Circumference --      Peak Flow --      Pain Score 07/12/22 1447 4  Pain Loc --      Pain Edu? --      Excl. in [place]? --    No data found.  Updated Vital Signs BP (!) 141/84 (BP Location: Left Arm)   Pulse 94   Temp 98.1 F (36.7 C) (Oral)   Resp 16   SpO2 97%   Visual Acuity Right Eye Distance:   Left Eye Distance:   Bilateral Distance:    Right Eye Near:   Left Eye Near:    Bilateral Near:     Physical Exam GEN:     alert, non-toxic appearing male in no distress    HENT:  mucus membranes moist, oropharyngeal without lesions or exudate, no tonsillar hypertrophy,  mild oropharyngeal erythema ,  moderate erythematous edematous turbinates, clear nasal discharge, bilateral TM normal EYES:   pupils equal and reactive, no scleral injection or discharge NECK:  normal ROM, no lymphadenopathy, no meningismus   RESP:  no increased work of breathing, clear to auscultation bilaterally CVS:   regular rate and rhythm Skin:   warm and dry, no rash  on visible skin    UC Treatments / Results  Labs (all labs ordered are listed, but only abnormal results are displayed) Labs Reviewed  RESP PANEL BY RT-PCR (RSV, FLU A&B, COVID)  [id number]    EKG   Radiology No results found.  Procedures Procedures (including critical care time)  Medications Ordered in UC Medications - No data to display  Initial Impression / Assessment and Plan / UC Course  I have reviewed the triage vital signs and the nursing notes.  Pertinent labs & imaging results that were available during my care of the patient were reviewed by me and considered in my medical decision making (see chart for details).       Pt is a 36 y.o. male who presents for 1-2 days of respiratory symptoms. Craig Guerra is afebrile here. Satting well on room air. Overall pt is ill but non-toxic appearing, well hydrated, without respiratory distress. Pulmonary exam is unremarkable.  COVID, RSV and influenza testing obtained and were negative. History consistent with viral respiratory illness. Discussed symptomatic treatment. Zofran for nausea. Typical duration of symptoms discussed.   Return and ED precautions given and voiced understanding. Discussed MDM, treatment plan and plan for follow-up with patient who agrees with plan.     Final Clinical Impressions(s) / UC Diagnoses   Final diagnoses:  Influenza-like illness  Exposure to influenza     Discharge Instructions      We will contact you if your COVID/influenza test is positive.  Please quarantine while you wait for the results.  If your test is negative you may resume normal activities.  If your test is positive please continue to quarantine for at least 5 days from your symptom onset or until you are without a fever for at least 24 hours after the medications.    If your were prescribed medication, stop by the pharmacy to pick them up.   You can take Tylenol and/or Ibuprofen as needed for fever reduction and pain relief.     For cough: honey 1/2 to 1 teaspoon (you can dilute the honey in water or another fluid).  You can also use guaifenesin and dextromethorphan for cough. You can use a humidifier for chest congestion and cough.  If you don't have a humidifier, you can sit in the bathroom with the hot shower running.      For sore throat: try warm salt water gargles,  Mucinex sore throat cough drops or cepacol lozenges, throat spray, warm tea or water with lemon/honey, popsicles or ice, or OTC cold relief medicine for throat discomfort. You can also purchase chloraseptic spray at the pharmacy or dollar store.   For congestion: take a daily anti-histamine like Zyrtec, Claritin, and a oral decongestant, such as pseudoephedrine.  You can also use Flonase 1-2 sprays in each nostril daily. Afrin is also a good option, if you do not have high blood pressure.    It is important to stay hydrated: drink plenty of fluids (water, gatorade/powerade/pedialyte, juices, or teas) to keep your throat moisturized and help further relieve irritation/discomfort.    Return or go to the Emergency Department if symptoms worsen or do not improve in the next few days      ED Prescriptions     Medication Sig Dispense Auth. Provider   ondansetron (ZOFRAN-ODT) 4 MG disintegrating tablet Take 1-2 tablets (4-8 mg total) by mouth every 8 (eight) hours as needed. 20 tablet Lyndee Hensen, DO      PDMP not reviewed this encounter.   Lyndee Hensen, DO 07/15/22 1514

## 2022-07-24 DIAGNOSIS — Z20822 Contact with and (suspected) exposure to covid-19: Secondary | ICD-10-CM | POA: Diagnosis not present

## 2022-07-24 DIAGNOSIS — R509 Fever, unspecified: Secondary | ICD-10-CM | POA: Diagnosis not present

## 2022-07-24 DIAGNOSIS — R6883 Chills (without fever): Secondary | ICD-10-CM | POA: Diagnosis not present

## 2022-07-24 DIAGNOSIS — R110 Nausea: Secondary | ICD-10-CM | POA: Diagnosis not present

## 2022-07-24 DIAGNOSIS — B349 Viral infection, unspecified: Secondary | ICD-10-CM | POA: Diagnosis not present

## 2022-09-04 DIAGNOSIS — E559 Vitamin D deficiency, unspecified: Secondary | ICD-10-CM | POA: Diagnosis not present

## 2022-12-24 ENCOUNTER — Encounter: Payer: Self-pay | Admitting: *Deleted

## 2022-12-24 ENCOUNTER — Emergency Department: Payer: Worker's Compensation

## 2022-12-24 ENCOUNTER — Other Ambulatory Visit: Payer: Self-pay

## 2022-12-24 ENCOUNTER — Emergency Department
Admission: EM | Admit: 2022-12-24 | Discharge: 2022-12-24 | Disposition: A | Discharge status: Home / Self Care | Payer: Worker's Compensation | Attending: Emergency Medicine | Admitting: Emergency Medicine

## 2022-12-24 DIAGNOSIS — W228XXA Striking against or struck by other objects, initial encounter: Secondary | ICD-10-CM | POA: Insufficient documentation

## 2022-12-24 DIAGNOSIS — S060X0A Concussion without loss of consciousness, initial encounter: Secondary | ICD-10-CM | POA: Insufficient documentation

## 2022-12-24 DIAGNOSIS — S0990XA Unspecified injury of head, initial encounter: Secondary | ICD-10-CM | POA: Diagnosis present

## 2022-12-24 LAB — SAMPLE TO BLOOD BANK

## 2022-12-24 LAB — CBC
HCT: 46 % (ref 39.0–52.0)
Hemoglobin: 15.5 g/dL (ref 13.0–17.0)
MCH: 27 pg (ref 26.0–34.0)
MCHC: 33.7 g/dL (ref 30.0–36.0)
MCV: 80.1 fL (ref 80.0–100.0)
Platelets: 331 10*3/uL (ref 150–400)
RBC: 5.74 MIL/uL (ref 4.22–5.81)
RDW: 13.1 % (ref 11.5–15.5)
WBC: 9.1 10*3/uL (ref 4.0–10.5)
nRBC: 0 % (ref 0.0–0.2)

## 2022-12-24 LAB — BASIC METABOLIC PANEL
Anion gap: 10 (ref 5–15)
BUN: 18 mg/dL (ref 6–20)
CO2: 23 mmol/L (ref 22–32)
Calcium: 9.5 mg/dL (ref 8.9–10.3)
Chloride: 103 mmol/L (ref 98–111)
Creatinine, Ser: 0.84 mg/dL (ref 0.61–1.24)
GFR, Estimated: >60 mL/min (ref >60)
Glucose, Bld: 98 mg/dL (ref 70–99)
Potassium: 3.7 mmol/L (ref 3.5–5.1)
Sodium: 136 mmol/L (ref 135–145)

## 2022-12-24 MED ORDER — METOCLOPRAMIDE HCL 5 MG/ML IJ SOLN
10.0000 mg | Freq: Once | INTRAMUSCULAR | Status: AC
  Administered 2022-12-24: 10 mg via INTRAVENOUS
  Filled 2022-12-24: qty 2

## 2022-12-24 MED ORDER — BUTALBITAL-APAP-CAFFEINE 50-325-40 MG PO TABS: 1.0000 | ORAL_TABLET | Freq: Four times a day (QID) | ORAL | 0 refills | Status: AC | PRN

## 2022-12-24 MED ORDER — SODIUM CHLORIDE 0.9 % IV BOLUS
1000.0000 mL | Freq: Once | INTRAVENOUS | Status: AC
  Administered 2022-12-24: 1000 mL via INTRAVENOUS

## 2022-12-24 MED ORDER — DIPHENHYDRAMINE HCL 50 MG/ML IJ SOLN
25.0000 mg | Freq: Once | INTRAMUSCULAR | Status: AC
  Administered 2022-12-24: 25 mg via INTRAVENOUS
  Filled 2022-12-24: qty 1

## 2022-12-24 MED ORDER — KETOROLAC TROMETHAMINE 30 MG/ML IJ SOLN
30.0000 mg | Freq: Once | INTRAMUSCULAR | Status: AC
  Administered 2022-12-24: 30 mg via INTRAVENOUS
  Filled 2022-12-24: qty 1

## 2022-12-24 NOTE — ED Triage Notes (Addendum)
Here by POV from home s/p head injury. W/C injury around 1300 when 750lb refrigerator slammed his face and head into pavement. Seen at [facility], instructed to come to ED. Pain, contusion, abrasions, bruising and swelling noted to nasal and orbital area and posterior head. C/o 8/10 head  pain and pressure. Also neck pain and nosebleed. TMs clear. Nasal drainage appears to be blood only, no halo sign. Difficult to apply c-collar d/t body habitus. Declined EMS from UC. Wife present.

## 2022-12-24 NOTE — ED Provider Notes (Signed)
[facility] Provider Note    Event Date/Time   First MD Initiated Contact with Patient 12/24/22 1730     (approximate)  History   Chief Complaint: Head Injury  HPI  Craig Guerra is a 37 y.o. male with a past medical history of obesity, presents to the emergency department for head and facial pain after an injury.  According to the patient he was moving a refrigerator when it fell hitting him in the face and then pinning him between the concrete in the refrigerator.  Patient is complaining of pain to the head neck and face.  Denies any back pain denies any chest or abdominal pain.  Patient states this occurred approximately 1 PM (nearly 5 hours ago).  States at first he did not want to come to the emergency department but states he has been feeling more dizzy and has had a worsening headache so he came for evaluation.  Patient states a bloody nose after the event, denies LOC denies vomiting.  Physical Exam   Triage Vital Signs: ED Triage Vitals  Encounter Vitals Group     BP 12/24/22 1715 (!) 163/79     Systolic BP Percentile --      Diastolic BP Percentile --      Pulse Rate 12/24/22 1715 88     Resp 12/24/22 1715 (!) 22     Temp 12/24/22 1715 98.3 F (36.8 C)     Temp src --      SpO2 12/24/22 1715 99 %     Weight 12/24/22 1718 (!) 311 lb (141.1 kg)     Height 12/24/22 1718 5\' 9"  (1.753 m)     Head Circumference --      Peak Flow --      Pain Score 12/24/22 1717 8     Pain Loc --      Pain Education --      Exclude from Growth Chart --     Most recent vital signs: Vitals:   12/24/22 1715  BP: (!) 163/79  Pulse: 88  Resp: (!) 22  Temp: 98.3 F (36.8 C)  SpO2: 99%    General: Awake, no distress. CV:  Good peripheral perfusion.  Regular rate and rhythm  Resp:  Normal effort.  Equal breath sounds bilaterally.  Abd:  No distention.  Soft, nontender.  No rebound or guarding. Other:  Mild cervical spine tenderness to palpation both midline  and paraspinal.  Mild left maxillary tenderness to palpation.  Mild tenderness over the nose.  Dried blood in bilateral nostrils but no septal hematoma no ongoing bleeding.  No oral injuries.   ED Results / Procedures / Treatments   EKG  EKG viewed and interpreted by myself shows a normal sinus rhythm at 80 bpm with a narrow QRS, normal axis, normal intervals, no concerning ST changes.  RADIOLOGY  I have reviewed and interpreted a CT scan of the head I do not see any obvious intracranial bleed on my evaluation.   MEDICATIONS ORDERED IN ED: Medications - No data to display   IMPRESSION / MDM / ASSESSMENT AND PLAN / ED COURSE  I reviewed the triage vital signs and the nursing notes.  Patient's presentation is most consistent with acute presentation with potential threat to life or bodily function.  Patient presents to the emergency department after a head injury that occurred approximately 5 hours ago.  No LOC, no vomiting.  Patient states he has had dizziness and a worsening headache.  Patient has dried blood in the nostrils but no septal hematoma mild left maxillary sinus area tenderness to palpation.  Normal tympanic membranes.  We will obtain CT imaging of the head face and neck.  We will continue to closely monitor while awaiting these results.  Patient agreeable to plan of care.  Patient's lab work is reassuring for normal CBC, normal chemistry.  CT scans of the head and neck are negative.  Facial CT shows a mild nasal fracture.  Given the patient's reassuring workup we will discharge home with a prescription for Fioricet to be used if needed for continued headache.  Patient states his headache is minimal currently after medications.  Patient will follow-up with his doctor.  Suspect likely concussion.  FINAL CLINICAL IMPRESSION(S) / ED DIAGNOSES   Headache Fall Head injury   Note:  This document was prepared using Dragon voice recognition software and may include unintentional  dictation errors.   Minna Antis, MD 12/24/22 1949

## 2023-05-19 DIAGNOSIS — R7303 Prediabetes: Secondary | ICD-10-CM | POA: Diagnosis not present

## 2023-08-30 DIAGNOSIS — E785 Hyperlipidemia, unspecified: Secondary | ICD-10-CM | POA: Diagnosis not present

## 2023-08-30 DIAGNOSIS — R7303 Prediabetes: Secondary | ICD-10-CM | POA: Diagnosis not present

## 2023-08-30 DIAGNOSIS — F0781 Postconcussional syndrome: Secondary | ICD-10-CM | POA: Diagnosis not present

## 2023-08-30 DIAGNOSIS — G4733 Obstructive sleep apnea (adult) (pediatric): Secondary | ICD-10-CM | POA: Diagnosis not present

## 2023-08-30 DIAGNOSIS — E669 Obesity, unspecified: Secondary | ICD-10-CM | POA: Diagnosis not present

## 2023-10-04 ENCOUNTER — Ambulatory Visit: Payer: Self-pay

## 2023-10-04 DIAGNOSIS — R070 Pain in throat: Secondary | ICD-10-CM | POA: Diagnosis not present

## 2023-10-04 DIAGNOSIS — J069 Acute upper respiratory infection, unspecified: Secondary | ICD-10-CM | POA: Diagnosis not present

## 2023-10-04 DIAGNOSIS — Z20822 Contact with and (suspected) exposure to covid-19: Secondary | ICD-10-CM | POA: Diagnosis not present

## 2023-10-04 NOTE — Telephone Encounter (Signed)
 Patient called back after not hearing anything from office. Patient calling to see if he would be able to go to an Urgent Care. Patient told that he can be seen in an Urgent Care. Patient verbalized understanding and will be going there.

## 2023-10-04 NOTE — Telephone Encounter (Signed)
  Chief Complaint: chest burning, cough Symptoms: yellow phlegm, sore throat Frequency: yesterday Pertinent Negatives: Patient denies SOB Disposition: [] ED /[] Urgent Care (no appt availability in office) / [x] Appointment(In office/virtual)/ []  [facility]/ [] Home Care/ [] Refused Recommended Disposition /[] [facility]/ []  Follow-up with PCP Additional Notes: Routing note for scheduleing to office Copied from CRM [id number]. Topic: Appointments - Appointment Scheduling >> Oct 04, 2023 11:45 AM Turkey B wrote: Pt has burning in the chest and yellow mucus, a little fatigued Reason for Disposition  [1] Continuous (nonstop) coughing interferes with work or school AND [2] no improvement using cough treatment per Care Advice  Answer Assessment - Initial Assessment Questions 1. ONSET: "When did the cough begin?"      Yesterday  2. SEVERITY: "How bad is the cough today?"      Coughing spells 3. SPUTUM: "Describe the color of your sputum" (none, dry cough; clear, white, yellow, green)     Yellow  4. HEMOPTYSIS: "Are you coughing up any blood?" If so ask: "How much?" (flecks, streaks, tablespoons, etc.)     No 5. DIFFICULTY BREATHING: "Are you having difficulty breathing?" If Yes, ask: "How bad is it?" (e.g., mild, moderate, severe)    - MILD: No SOB at rest, mild SOB with walking, speaks normally in sentences, can lie down, no retractions, pulse < 100.    - MODERATE: SOB at rest, SOB with minimal exertion and prefers to sit, cannot lie down flat, speaks in phrases, mild retractions, audible wheezing, pulse 100-120.    - SEVERE: Very SOB at rest, speaks in single words, struggling to breathe, sitting hunched forward, retractions, pulse > 120      none 6. FEVER: "Do you have a fever?" If Yes, ask: "What is your temperature, how was it measured, and when did it start?"     Hot flashes chills 8. LUNG HISTORY: "Do you have any history of lung disease?"  (e.g., pulmonary embolus,  asthma, emphysema)     no 10. OTHER SYMPTOMS: "Do you have any other symptoms?" (e.g., runny nose, wheezing, chest pain)       Throat hurting , burning hurts breath  Protocols used: Cough - Acute Productive-A-AH

## 2024-02-03 DIAGNOSIS — Z20822 Contact with and (suspected) exposure to covid-19: Secondary | ICD-10-CM | POA: Diagnosis not present

## 2024-02-03 DIAGNOSIS — R070 Pain in throat: Secondary | ICD-10-CM | POA: Diagnosis not present

## 2024-02-03 DIAGNOSIS — J069 Acute upper respiratory infection, unspecified: Secondary | ICD-10-CM | POA: Diagnosis not present
